# Patient Record
Sex: Male | Born: 1935
Health system: Southern US, Community
[De-identification: ages and names within clinical notes are randomized; demographics above are authoritative.]

## PROBLEM LIST (undated history)

## (undated) DIAGNOSIS — R011 Cardiac murmur, unspecified: Secondary | ICD-10-CM

## (undated) DIAGNOSIS — H269 Unspecified cataract: Secondary | ICD-10-CM

## (undated) DIAGNOSIS — C801 Malignant (primary) neoplasm, unspecified: Secondary | ICD-10-CM

## (undated) DIAGNOSIS — M1990 Unspecified osteoarthritis, unspecified site: Secondary | ICD-10-CM

## (undated) DIAGNOSIS — Z860101 Personal history of adenomatous and serrated colon polyps: Secondary | ICD-10-CM

## (undated) DIAGNOSIS — Z974 Presence of external hearing-aid: Secondary | ICD-10-CM

## (undated) DIAGNOSIS — I10 Essential (primary) hypertension: Secondary | ICD-10-CM

## (undated) DIAGNOSIS — Z86010 Personal history of colonic polyps: Secondary | ICD-10-CM

## (undated) DIAGNOSIS — E871 Hypo-osmolality and hyponatremia: Secondary | ICD-10-CM

## (undated) DIAGNOSIS — T7840XA Allergy, unspecified, initial encounter: Secondary | ICD-10-CM

## (undated) HISTORY — DX: Unspecified cataract: H26.9

## (undated) HISTORY — DX: Essential (primary) hypertension: I10

## (undated) HISTORY — PX: COLONOSCOPY: SHX174

## (undated) HISTORY — PX: EYE SURGERY: SHX253

## (undated) HISTORY — DX: Personal history of adenomatous and serrated colon polyps: Z86.0101

## (undated) HISTORY — DX: Personal history of colonic polyps: Z86.010

## (undated) HISTORY — DX: Unspecified osteoarthritis, unspecified site: M19.90

## (undated) HISTORY — DX: Hypo-osmolality and hyponatremia: E87.1

## (undated) HISTORY — DX: Allergy, unspecified, initial encounter: T78.40XA

---

## 2012-02-11 DIAGNOSIS — H2510 Age-related nuclear cataract, unspecified eye: Secondary | ICD-10-CM | POA: Diagnosis not present

## 2012-04-07 ENCOUNTER — Ambulatory Visit: Payer: Self-pay | Admitting: Gastroenterology

## 2012-04-07 DIAGNOSIS — Z79899 Other long term (current) drug therapy: Secondary | ICD-10-CM | POA: Diagnosis not present

## 2012-04-07 DIAGNOSIS — D126 Benign neoplasm of colon, unspecified: Secondary | ICD-10-CM | POA: Diagnosis not present

## 2012-04-07 DIAGNOSIS — I10 Essential (primary) hypertension: Secondary | ICD-10-CM | POA: Diagnosis not present

## 2012-04-07 DIAGNOSIS — Z1211 Encounter for screening for malignant neoplasm of colon: Secondary | ICD-10-CM | POA: Diagnosis not present

## 2012-04-07 DIAGNOSIS — K5730 Diverticulosis of large intestine without perforation or abscess without bleeding: Secondary | ICD-10-CM | POA: Diagnosis not present

## 2012-04-07 DIAGNOSIS — Z86010 Personal history of colonic polyps: Secondary | ICD-10-CM | POA: Insufficient documentation

## 2012-04-07 DIAGNOSIS — Z7982 Long term (current) use of aspirin: Secondary | ICD-10-CM | POA: Diagnosis not present

## 2012-04-07 LAB — HM COLONOSCOPY

## 2012-04-09 LAB — PATHOLOGY REPORT

## 2012-05-15 DIAGNOSIS — L821 Other seborrheic keratosis: Secondary | ICD-10-CM | POA: Diagnosis not present

## 2012-05-15 DIAGNOSIS — L570 Actinic keratosis: Secondary | ICD-10-CM | POA: Diagnosis not present

## 2012-10-01 DIAGNOSIS — Z23 Encounter for immunization: Secondary | ICD-10-CM | POA: Diagnosis not present

## 2012-10-31 DIAGNOSIS — N401 Enlarged prostate with lower urinary tract symptoms: Secondary | ICD-10-CM | POA: Diagnosis not present

## 2012-12-15 DIAGNOSIS — N400 Enlarged prostate without lower urinary tract symptoms: Secondary | ICD-10-CM | POA: Diagnosis not present

## 2012-12-15 DIAGNOSIS — Z0000 Encounter for general adult medical examination without abnormal findings: Secondary | ICD-10-CM | POA: Diagnosis not present

## 2012-12-15 DIAGNOSIS — I10 Essential (primary) hypertension: Secondary | ICD-10-CM | POA: Diagnosis not present

## 2013-02-24 DIAGNOSIS — H2510 Age-related nuclear cataract, unspecified eye: Secondary | ICD-10-CM | POA: Diagnosis not present

## 2013-03-19 DIAGNOSIS — I10 Essential (primary) hypertension: Secondary | ICD-10-CM | POA: Diagnosis not present

## 2013-03-19 DIAGNOSIS — L570 Actinic keratosis: Secondary | ICD-10-CM | POA: Diagnosis not present

## 2013-03-19 DIAGNOSIS — N400 Enlarged prostate without lower urinary tract symptoms: Secondary | ICD-10-CM | POA: Diagnosis not present

## 2013-03-19 DIAGNOSIS — Z0000 Encounter for general adult medical examination without abnormal findings: Secondary | ICD-10-CM | POA: Diagnosis not present

## 2013-06-03 DIAGNOSIS — H905 Unspecified sensorineural hearing loss: Secondary | ICD-10-CM | POA: Diagnosis not present

## 2013-09-14 DIAGNOSIS — Z23 Encounter for immunization: Secondary | ICD-10-CM | POA: Diagnosis not present

## 2013-11-02 DIAGNOSIS — N401 Enlarged prostate with lower urinary tract symptoms: Secondary | ICD-10-CM | POA: Diagnosis not present

## 2013-12-29 DIAGNOSIS — N400 Enlarged prostate without lower urinary tract symptoms: Secondary | ICD-10-CM | POA: Diagnosis not present

## 2013-12-29 DIAGNOSIS — I10 Essential (primary) hypertension: Secondary | ICD-10-CM | POA: Diagnosis not present

## 2013-12-29 DIAGNOSIS — Z0000 Encounter for general adult medical examination without abnormal findings: Secondary | ICD-10-CM | POA: Diagnosis not present

## 2013-12-30 DIAGNOSIS — I10 Essential (primary) hypertension: Secondary | ICD-10-CM | POA: Diagnosis not present

## 2014-03-18 DIAGNOSIS — L819 Disorder of pigmentation, unspecified: Secondary | ICD-10-CM | POA: Diagnosis not present

## 2014-03-18 DIAGNOSIS — L570 Actinic keratosis: Secondary | ICD-10-CM | POA: Diagnosis not present

## 2014-03-18 DIAGNOSIS — D1800 Hemangioma unspecified site: Secondary | ICD-10-CM | POA: Diagnosis not present

## 2014-03-24 DIAGNOSIS — Z0000 Encounter for general adult medical examination without abnormal findings: Secondary | ICD-10-CM | POA: Diagnosis not present

## 2014-03-24 DIAGNOSIS — N400 Enlarged prostate without lower urinary tract symptoms: Secondary | ICD-10-CM | POA: Diagnosis not present

## 2014-03-24 DIAGNOSIS — I10 Essential (primary) hypertension: Secondary | ICD-10-CM | POA: Diagnosis not present

## 2014-03-30 DIAGNOSIS — H40009 Preglaucoma, unspecified, unspecified eye: Secondary | ICD-10-CM | POA: Diagnosis not present

## 2014-07-02 DIAGNOSIS — H903 Sensorineural hearing loss, bilateral: Secondary | ICD-10-CM | POA: Diagnosis not present

## 2014-07-02 DIAGNOSIS — H6120 Impacted cerumen, unspecified ear: Secondary | ICD-10-CM | POA: Diagnosis not present

## 2014-09-24 DIAGNOSIS — Z23 Encounter for immunization: Secondary | ICD-10-CM | POA: Diagnosis not present

## 2015-01-21 DIAGNOSIS — Z1389 Encounter for screening for other disorder: Secondary | ICD-10-CM | POA: Diagnosis not present

## 2015-01-21 DIAGNOSIS — Z0000 Encounter for general adult medical examination without abnormal findings: Secondary | ICD-10-CM | POA: Diagnosis not present

## 2015-01-21 DIAGNOSIS — I10 Essential (primary) hypertension: Secondary | ICD-10-CM | POA: Diagnosis not present

## 2015-01-21 DIAGNOSIS — Z23 Encounter for immunization: Secondary | ICD-10-CM | POA: Diagnosis not present

## 2015-01-24 DIAGNOSIS — I10 Essential (primary) hypertension: Secondary | ICD-10-CM | POA: Diagnosis not present

## 2015-01-24 DIAGNOSIS — Z136 Encounter for screening for cardiovascular disorders: Secondary | ICD-10-CM | POA: Diagnosis not present

## 2015-01-24 LAB — BASIC METABOLIC PANEL
BUN: 26 mg/dL — AB (ref 4–21)
Creatinine: 0.9 mg/dL (ref 0.6–1.3)
GLUCOSE: 103 mg/dL
Potassium: 4.4 mmol/L (ref 3.4–5.3)
Sodium: 136 mmol/L — AB (ref 137–147)

## 2015-01-24 LAB — LIPID PANEL
Cholesterol: 136 mg/dL (ref 0–200)
HDL: 46 mg/dL (ref 35–70)
LDL Cholesterol: 78 mg/dL
Triglycerides: 59 mg/dL (ref 40–160)

## 2015-03-23 DIAGNOSIS — L853 Xerosis cutis: Secondary | ICD-10-CM | POA: Diagnosis not present

## 2015-03-23 DIAGNOSIS — D229 Melanocytic nevi, unspecified: Secondary | ICD-10-CM | POA: Diagnosis not present

## 2015-03-23 DIAGNOSIS — L578 Other skin changes due to chronic exposure to nonionizing radiation: Secondary | ICD-10-CM | POA: Diagnosis not present

## 2015-03-23 DIAGNOSIS — I8310 Varicose veins of unspecified lower extremity with inflammation: Secondary | ICD-10-CM | POA: Diagnosis not present

## 2015-03-23 DIAGNOSIS — D692 Other nonthrombocytopenic purpura: Secondary | ICD-10-CM | POA: Diagnosis not present

## 2015-03-23 DIAGNOSIS — L718 Other rosacea: Secondary | ICD-10-CM | POA: Diagnosis not present

## 2015-03-23 DIAGNOSIS — L720 Epidermal cyst: Secondary | ICD-10-CM | POA: Diagnosis not present

## 2015-03-23 DIAGNOSIS — T07 Unspecified multiple injuries: Secondary | ICD-10-CM | POA: Diagnosis not present

## 2015-03-23 DIAGNOSIS — L570 Actinic keratosis: Secondary | ICD-10-CM | POA: Diagnosis not present

## 2015-03-23 DIAGNOSIS — L821 Other seborrheic keratosis: Secondary | ICD-10-CM | POA: Diagnosis not present

## 2015-03-23 DIAGNOSIS — B359 Dermatophytosis, unspecified: Secondary | ICD-10-CM | POA: Diagnosis not present

## 2015-03-23 DIAGNOSIS — Z1283 Encounter for screening for malignant neoplasm of skin: Secondary | ICD-10-CM | POA: Diagnosis not present

## 2015-05-05 DIAGNOSIS — H40003 Preglaucoma, unspecified, bilateral: Secondary | ICD-10-CM | POA: Diagnosis not present

## 2015-06-15 ENCOUNTER — Telehealth: Payer: Self-pay | Admitting: Emergency Medicine

## 2015-06-15 MED ORDER — LISINOPRIL-HYDROCHLOROTHIAZIDE 20-12.5 MG PO TABS: 1.0000 | ORAL_TABLET | Freq: Every day | ORAL | Status: DC

## 2015-06-15 NOTE — Telephone Encounter (Signed)
Pt pharmacy sent over request for refill for pt medication.

## 2015-07-07 ENCOUNTER — Other Ambulatory Visit: Payer: Self-pay | Admitting: *Deleted

## 2015-07-07 ENCOUNTER — Other Ambulatory Visit: Payer: Self-pay | Admitting: Family Medicine

## 2015-07-07 MED ORDER — LISINOPRIL-HYDROCHLOROTHIAZIDE 20-12.5 MG PO TABS: 1.0000 | ORAL_TABLET | Freq: Every day | ORAL | Status: DC

## 2015-07-07 NOTE — Telephone Encounter (Signed)
Rx resent to pharmacy

## 2015-09-16 DIAGNOSIS — Z23 Encounter for immunization: Secondary | ICD-10-CM | POA: Diagnosis not present

## 2015-12-18 ENCOUNTER — Other Ambulatory Visit: Payer: Self-pay | Admitting: Family Medicine

## 2015-12-21 ENCOUNTER — Other Ambulatory Visit: Payer: Self-pay | Admitting: Family Medicine

## 2015-12-21 MED ORDER — VERAPAMIL HCL ER 120 MG PO CP24: 120.0000 mg | ORAL_CAPSULE | Freq: Every day | ORAL | Status: DC

## 2016-01-20 DIAGNOSIS — I10 Essential (primary) hypertension: Secondary | ICD-10-CM | POA: Insufficient documentation

## 2016-01-20 DIAGNOSIS — N400 Enlarged prostate without lower urinary tract symptoms: Secondary | ICD-10-CM | POA: Insufficient documentation

## 2016-01-23 ENCOUNTER — Encounter: Payer: Self-pay | Admitting: Family Medicine

## 2016-01-23 ENCOUNTER — Ambulatory Visit (INDEPENDENT_AMBULATORY_CARE_PROVIDER_SITE_OTHER): Payer: Medicare Other | Admitting: Family Medicine

## 2016-01-23 VITALS — BP 140/70 | HR 67 | Temp 97.5°F | Resp 16 | Ht 68.25 in | Wt 163.0 lb

## 2016-01-23 DIAGNOSIS — I10 Essential (primary) hypertension: Secondary | ICD-10-CM | POA: Diagnosis not present

## 2016-01-23 DIAGNOSIS — Z0000 Encounter for general adult medical examination without abnormal findings: Secondary | ICD-10-CM

## 2016-01-23 NOTE — Progress Notes (Signed)
Patient: Travis Austin, Male    DOB: June 21, 1931, 82 y.o.   MRN: [id number] Visit Date: 01/23/2016  Today's Provider: Lelon Huh, MD   Chief Complaint  Patient presents with  . Medicare Wellness  . Hypertension   Subjective:    Annual wellness visit Travis Austin is a 82 y.o. male. He feels well. He reports exercising weekly. He reports he is sleeping well.  -----------------------------------------------------------  Hypertension, follow-up:  BP Readings from Last 3 Encounters:  01/21/15 136/64    He was last seen for hypertension 1 years ago.  BP at that visit was 136/64. Management since that visit includes no changes. He reports good compliance with treatment. He is not having side effects.  He is exercising. He is adherent to low salt diet.   Outside blood pressures are [id number] systolic over 0000000 diastolic. He is experiencing none.  Patient denies chest pain, chest pressure/discomfort, claudication, dyspnea, exertional chest pressure/discomfort, fatigue, irregular heart beat, lower extremity edema, near-syncope, orthopnea, palpitations, paroxysmal nocturnal dyspnea, syncope and tachypnea.   Cardiovascular risk factors include advanced age (older than 65 for men, 36 for women) and hypertension.  Use of agents associated with hypertension: NSAIDS.     Weight trend: stable Wt Readings from Last 3 Encounters:  01/21/15 162 lb (73.483 kg)    Current diet: in general, a "healthy" diet    ------------------------------------------------------------------------    Review of Systems  Constitutional: Negative for fever, chills, appetite change and fatigue.  HENT: Positive for hearing loss. Negative for congestion, ear pain, nosebleeds and trouble swallowing.   Eyes: Negative for pain and visual disturbance.  Respiratory: Negative for cough, chest tightness and shortness of breath.   Cardiovascular: Negative for chest pain, palpitations and leg swelling.    Gastrointestinal: Negative for nausea, vomiting, abdominal pain, diarrhea, constipation and blood in stool.  Endocrine: Negative for polydipsia, polyphagia and polyuria.  Genitourinary: Negative for dysuria and flank pain.  Musculoskeletal: Negative for myalgias, back pain, joint swelling, arthralgias and neck stiffness.  Skin: Negative for color change, rash and wound.  Allergic/Immunologic: Positive for food allergies.  Neurological: Negative for dizziness, tremors, seizures, speech difficulty, weakness, light-headedness and headaches.  Psychiatric/Behavioral: Negative for behavioral problems, confusion, sleep disturbance, dysphoric mood and decreased concentration. The patient is not nervous/anxious.   All other systems reviewed and are negative.   Social History   Social History  . Marital Status: Widowed    Spouse Name: N/A  . Number of Children: 1  . Years of Education: N/A   Occupational History  . Retired    Social History Main Topics  . Smoking status: Never Smoker   . Smokeless tobacco: Not on file  . Alcohol Use: 0.0 oz/week    0 Standard drinks or equivalent per week     Comment: 1-2 glasses of wine daily  . Drug Use: No  . Sexual Activity: Not on file   Other Topics Concern  . Not on file   Social History Narrative    Past Medical History  Diagnosis Date  . History of adenomatous polyp of colon      Patient Active Problem List   Diagnosis Date Noted  . BPH (benign prostatic hypertrophy) 01/20/2016  . Hypertension 01/20/2016  . History of adenomatous polyp of colon 04/07/2012  . Headache 08/24/2011    No past surgical history on file.  His family history includes Arthritis in his mother; Heart disease in his father; Hypertension in his father; Parkinson's disease  in his mother.    Previous Medications   ASPIRIN 81 MG TABLET    Take 1 tablet by mouth daily.   LISINOPRIL-HYDROCHLOROTHIAZIDE (ZESTORETIC) 20-12.5 MG PER TABLET    Take 1 tablet by  mouth daily.   MULTIPLE VITAMINS-MINERALS (MULTIVITAMIN ADULT PO)    Take 1 tablet by mouth daily.   VERAPAMIL (VERELAN PM) 120 MG 24 HR CAPSULE    Take 1 capsule (120 mg total) by mouth daily.    Patient Care Team: Birdie Sons, MD as PCP - General (Family Medicine) Abbie Sons, MD (Urology) Provider Not In System     Objective:   Vitals: BP 140/70 mmHg  Pulse 67  Temp(Src) 97.5 F (36.4 C) (Oral)  Resp 16  Ht 5' 8.25" (1.734 m)  Wt 163 lb (73.936 kg)  BMI 24.59 kg/m2  SpO2 97%  Physical Exam   General Appearance:    Alert, cooperative, no distress, appears stated age  Head:    Normocephalic, without obvious abnormality, atraumatic  Eyes:    PERRL, conjunctiva/corneas clear, EOM's intact, fundi    benign, both eyes       Ears:    Normal TM's and external ear canals, both ears  Nose:   Nares normal, septum midline, mucosa normal, no drainage   or sinus tenderness  Throat:   Lips, mucosa, and tongue normal; teeth and gums normal  Neck:   Supple, symmetrical, trachea midline, no adenopathy;       thyroid:  No enlargement/tenderness/nodules; no carotid   bruit or JVD  Back:     Symmetric, no curvature, ROM normal, no CVA tenderness  Lungs:     Clear to auscultation bilaterally, respirations unlabored  Chest wall:    No tenderness or deformity  Heart:    Regular rate and rhythm, S1 and S2 normal, no murmur, rub   or gallop  Abdomen:     Soft, non-tender, bowel sounds active all four quadrants,    no masses, no organomegaly  Genitalia:    deferred  Rectal:    deferred  Extremities:   Extremities normal, atraumatic, no cyanosis or edema  Pulses:   2+ and symmetric all extremities  Skin:   Skin color, texture, turgor normal, no rashes or lesions  Lymph nodes:   Cervical, supraclavicular, and axillary nodes normal  Neurologic:   CNII-XII intact. Normal strength, sensation and reflexes      throughout    Activities of Daily Living In your present state of health,  do you have any difficulty performing the following activities: 01/23/2016  Hearing? Y  Vision? Y  Difficulty concentrating or making decisions? N  Walking or climbing stairs? N  Dressing or bathing? N  Doing errands, shopping? N    Fall Risk Assessment Fall Risk  01/23/2016  Falls in the past year? Yes  Number falls in past yr: 1  Injury with Fall? No  Follow up Falls evaluation completed     Depression Screen PHQ 2/9 Scores 01/23/2016  PHQ - 2 Score 0    Cognitive Testing - 6-CIT  Correct? Score   What year is it? yes 0 0 or 4  What month is it? yes 0 0 or 3  Memorize:    Pia Mau,  44,  [place],      What time is it? (within 1 hour) yes 0 0 or 3  Count backwards from 20 yes 0 0, 2, or 4  Name the months of the  year yes 0 0, 2, or 4  Repeat name & address above no 3 0, 2, 4, 6, 8, or 10       TOTAL SCORE  3/28   Interpretation:  Normal  Normal (0-7) Abnormal (8-28)   Audit-C Alcohol Use Screening  Question Answer Points  How often do you have alcoholic drink? 4 times weekly 4  On days you do drink alcohol, how many drinks do you typically consume? 1 or 2 0  How oftey will you drink 6 or more in a total? never 0  Total Score:  4   A score of 3 or more in women, and 4 or more in men indicates increased risk for alcohol abuse, EXCEPT if all of the points are from question 1.     Assessment & Plan:     Annual Wellness Visit  Reviewed patient's Family Medical History Reviewed and updated list of patient's medical providers Assessment of cognitive impairment was done Assessed patient's functional ability Established a written schedule for health screening [place] Completed and Reviewed  Exercise Activities and Dietary recommendations Goals    None      Immunization History  Administered Date(s) Administered  . Pneumococcal Conjugate-13 01/21/2015  . Pneumococcal Polysaccharide-23 07/02/2001  . Td 05/29/2004  . Tdap  08/24/2011  . Zoster 11/24/2008    Health Maintenance  Topic Date Due  . INFLUENZA VACCINE  07/17/2016 (Originally 07/18/2015)  . TETANUS/TDAP  08/23/2021  . ZOSTAVAX  Completed  . PNA vac Low Risk Adult  Completed      Discussed health benefits of physical activity, and encouraged him to engage in regular exercise appropriate for his age and condition.    ------------------------------------------------------------------------------------------------------------ 1. Medicare annual wellness visit, subsequent   2. Essential hypertension Well controlled.  Continue current medications.   - Renal function panel - EKG 12-Lead

## 2016-01-24 LAB — RENAL FUNCTION PANEL
Albumin: 4.2 g/dL (ref 3.5–4.7)
BUN/Creatinine Ratio: 26 — ABNORMAL HIGH (ref 10–22)
BUN: 24 mg/dL (ref 8–27)
CHLORIDE: 90 mmol/L — AB (ref 96–106)
CO2: 24 mmol/L (ref 18–29)
Calcium: 9.2 mg/dL (ref 8.6–10.2)
Creatinine, Ser: 0.94 mg/dL (ref 0.76–1.27)
GFR, EST AFRICAN AMERICAN: 87 mL/min/{1.73_m2} (ref >59)
GFR, EST NON AFRICAN AMERICAN: 75 mL/min/{1.73_m2} (ref >59)
GLUCOSE: 108 mg/dL — AB (ref 65–99)
Phosphorus: 3.9 mg/dL (ref 2.5–4.5)
Potassium: 4.3 mmol/L (ref 3.5–5.2)
Sodium: 132 mmol/L — ABNORMAL LOW (ref 134–144)

## 2016-03-05 ENCOUNTER — Other Ambulatory Visit: Payer: Self-pay | Admitting: Family Medicine

## 2016-03-22 DIAGNOSIS — D229 Melanocytic nevi, unspecified: Secondary | ICD-10-CM | POA: Diagnosis not present

## 2016-03-22 DIAGNOSIS — D485 Neoplasm of uncertain behavior of skin: Secondary | ICD-10-CM | POA: Diagnosis not present

## 2016-03-22 DIAGNOSIS — D692 Other nonthrombocytopenic purpura: Secondary | ICD-10-CM | POA: Diagnosis not present

## 2016-03-22 DIAGNOSIS — B351 Tinea unguium: Secondary | ICD-10-CM | POA: Diagnosis not present

## 2016-03-22 DIAGNOSIS — D1800 Hemangioma unspecified site: Secondary | ICD-10-CM | POA: Diagnosis not present

## 2016-03-22 DIAGNOSIS — L820 Inflamed seborrheic keratosis: Secondary | ICD-10-CM | POA: Diagnosis not present

## 2016-03-22 DIAGNOSIS — L821 Other seborrheic keratosis: Secondary | ICD-10-CM | POA: Diagnosis not present

## 2016-03-22 DIAGNOSIS — L578 Other skin changes due to chronic exposure to nonionizing radiation: Secondary | ICD-10-CM | POA: Diagnosis not present

## 2016-03-22 DIAGNOSIS — Z1283 Encounter for screening for malignant neoplasm of skin: Secondary | ICD-10-CM | POA: Diagnosis not present

## 2016-03-22 DIAGNOSIS — C44712 Basal cell carcinoma of skin of right lower limb, including hip: Secondary | ICD-10-CM | POA: Diagnosis not present

## 2016-03-22 DIAGNOSIS — L812 Freckles: Secondary | ICD-10-CM | POA: Diagnosis not present

## 2016-04-10 DIAGNOSIS — C44712 Basal cell carcinoma of skin of right lower limb, including hip: Secondary | ICD-10-CM | POA: Diagnosis not present

## 2016-05-07 DIAGNOSIS — H2513 Age-related nuclear cataract, bilateral: Secondary | ICD-10-CM | POA: Diagnosis not present

## 2016-06-04 DIAGNOSIS — H903 Sensorineural hearing loss, bilateral: Secondary | ICD-10-CM | POA: Diagnosis not present

## 2016-08-09 ENCOUNTER — Other Ambulatory Visit: Payer: Self-pay | Admitting: Family Medicine

## 2016-08-10 NOTE — Telephone Encounter (Signed)
Had wellness on 01/23/2016. Renaldo Fiddler, CMA

## 2016-10-01 DIAGNOSIS — Z23 Encounter for immunization: Secondary | ICD-10-CM | POA: Diagnosis not present

## 2016-12-28 ENCOUNTER — Encounter: Payer: Self-pay | Admitting: Family Medicine

## 2016-12-28 ENCOUNTER — Ambulatory Visit (INDEPENDENT_AMBULATORY_CARE_PROVIDER_SITE_OTHER): Payer: Medicare Other | Admitting: Family Medicine

## 2016-12-28 VITALS — BP 130/80 | HR 91 | Temp 101.6°F | Resp 16 | Wt 169.0 lb

## 2016-12-28 DIAGNOSIS — R6889 Other general symptoms and signs: Secondary | ICD-10-CM

## 2016-12-28 MED ORDER — OSELTAMIVIR PHOSPHATE 75 MG PO CAPS: 75.0000 mg | ORAL_CAPSULE | Freq: Two times a day (BID) | ORAL | 0 refills | Status: AC

## 2016-12-28 NOTE — Progress Notes (Signed)
Patient: Travis Austin Male    DOB: July 08, 1931   83 y.o.   MRN: [id number] Visit Date: 12/28/2016  Today's Provider: Lelon Huh, MD   Chief Complaint  Patient presents with  . Fever  . Cough   Subjective:    Cough started 2 days ago. Patient states cough is non-productive. Patient feels feverish and fatigue. Also has muscle aches and  Headache. Patient has not taken any medications for his symptoms.    Cough  This is a new problem. The current episode started in the past 7 days (2 days ago). The problem has been gradually worsening. The problem occurs every few minutes. The cough is non-productive. Associated symptoms include chills, a fever, headaches, myalgias and a sore throat. Pertinent negatives include no chest pain, ear congestion, ear pain, heartburn, hemoptysis, nasal congestion, postnasal drip, rash, rhinorrhea, shortness of breath, sweats, weight loss or wheezing. Nothing aggravates the symptoms. He has tried nothing for the symptoms.  Cough has improved today. Throat is still sore. Very fatigued and achy.      No Active Allergies   Current Outpatient Prescriptions:  .  aspirin 81 MG tablet, Take 1 tablet by mouth daily., Disp: , Rfl:  .  lisinopril-hydrochlorothiazide (PRINZIDE,ZESTORETIC) 20-12.5 MG tablet, TAKE 1 TABLET DAILY, Disp: 90 tablet, Rfl: 3 .  Multiple Vitamins-Minerals (MULTIVITAMIN ADULT PO), Take 1 tablet by mouth daily., Disp: , Rfl:  .  verapamil (VERELAN PM) 120 MG 24 hr capsule, TAKE 1 CAPSULE DAILY, Disp: 90 capsule, Rfl: 4  Review of Systems  Constitutional: Positive for chills, fatigue and fever. Negative for weight loss.  HENT: Positive for sore throat. Negative for congestion, ear pain, postnasal drip, rhinorrhea, sinus pain, sinus pressure and sneezing.   Respiratory: Positive for cough. Negative for hemoptysis, shortness of breath and wheezing.   Cardiovascular: Negative for chest pain.  Gastrointestinal: Negative for heartburn.    Musculoskeletal: Positive for myalgias.  Skin: Negative for rash.  Neurological: Positive for weakness and headaches.    Social History  Substance Use Topics  . Smoking status: Never Smoker  . Smokeless tobacco: Not on file  . Alcohol use 0.0 oz/week     Comment: 1-2 glasses of wine daily   Objective:   BP 130/80 (BP Location: Right Arm, Patient Position: Sitting, Cuff Size: Normal)   Pulse 91   Temp (!) 101.6 F (38.7 C) (Oral)   Resp 16   Wt 169 lb (76.7 kg)   SpO2 96%   BMI 25.51 kg/m   Physical Exam  General Appearance:    Alert, cooperative, no distress  HENT:   ENT exam normal, no neck nodes or sinus tenderness, slight erythema pharynx.   Eyes:    PERRL, conjunctiva/corneas clear, EOM's intact       Lungs:     Clear to auscultation bilaterally, respirations unlabored  Heart:    Regular rate and rhythm  Neurologic:   Awake, alert, oriented x 3. No apparent focal neurological           defect.           Assessment & Plan:     1. Flu-like symptoms Likely influenza. No sign of complications or bacterial infection.  - oseltamivir (TAMIFLU) 75 MG capsule; Take 1 capsule (75 mg total) by mouth 2 (two) times daily.  Dispense: 10 capsule; Refill: 0  Call if symptoms change or if not rapidly improving.          Lelon Huh,  MD  [facility]

## 2016-12-28 NOTE — Patient Instructions (Signed)

## 2017-01-23 ENCOUNTER — Ambulatory Visit (INDEPENDENT_AMBULATORY_CARE_PROVIDER_SITE_OTHER): Payer: Medicare Other

## 2017-01-23 ENCOUNTER — Ambulatory Visit (INDEPENDENT_AMBULATORY_CARE_PROVIDER_SITE_OTHER): Payer: Medicare Other | Admitting: Family Medicine

## 2017-01-23 VITALS — BP 138/70

## 2017-01-23 VITALS — BP 141/70 | HR 60 | Temp 97.9°F | Ht 68.0 in | Wt 161.8 lb

## 2017-01-23 DIAGNOSIS — I10 Essential (primary) hypertension: Secondary | ICD-10-CM

## 2017-01-23 DIAGNOSIS — Z0000 Encounter for general adult medical examination without abnormal findings: Secondary | ICD-10-CM | POA: Diagnosis not present

## 2017-01-23 NOTE — Patient Instructions (Addendum)

## 2017-01-23 NOTE — Progress Notes (Signed)
Subjective:   Travis Austin is a 83 y.o. male who presents for Medicare Annual/Subsequent preventive examination.  Review of Systems:  N/A  Cardiac Risk Factors include: advanced age (>25men, >88 women);hypertension;male gender     Objective:    Vitals: BP (!) 141/70 (BP Location: Right Arm)   Pulse 60   Temp 97.9 F (36.6 C) (Oral)   Ht 5\' 8"  (1.727 m)   Wt 161 lb 12.8 oz (73.4 kg)   BMI 24.60 kg/m   Body mass index is 24.6 kg/m.  Tobacco History  Smoking Status  . Never Smoker  Smokeless Tobacco  . Never Used     Counseling given: Not Answered   Past Medical History:  Diagnosis Date  . History of adenomatous polyp of colon    History reviewed. No pertinent surgical history. Family History  Problem Relation Age of Onset  . Arthritis Mother   . Parkinson's disease Mother   . Heart disease Father   . Hypertension Father    History  Sexual Activity  . Sexual activity: Not on file    Outpatient Encounter Prescriptions as of 01/23/2017  Medication Sig  . aspirin 81 MG tablet Take 1 tablet by mouth daily.  Marland Kitchen lisinopril-hydrochlorothiazide (PRINZIDE,ZESTORETIC) 20-12.5 MG tablet TAKE 1 TABLET DAILY  . Multiple Vitamins-Minerals (MULTIVITAMIN ADULT PO) Take 1 tablet by mouth daily.  . verapamil (VERELAN PM) 120 MG 24 hr capsule TAKE 1 CAPSULE DAILY   No facility-administered encounter medications on file as of 01/23/2017.     Activities of Daily Living In your present state of health, do you have any difficulty performing the following activities: 01/23/2017  Hearing? Y  Vision? Y  Difficulty concentrating or making decisions? N  Walking or climbing stairs? N  Dressing or bathing? N  Doing errands, shopping? N  Preparing Food and eating ? N  Using the Toilet? N  In the past six months, have you accidently leaked urine? N  Do you have problems with loss of bowel control? N  Managing your Medications? N  Managing your Finances? N  Housekeeping or managing  your Housekeeping? N  Some recent data might be hidden    Patient Care Team: Birdie Sons, MD as PCP - General (Family Medicine) Clyde Canterbury, MD as Referring Physician (Otolaryngology) Delora Fuel, MD as Referring Physician (Audiology) Eulogio Bear, MD as Consulting Physician (Ophthalmology) Ralene Bathe, MD as Consulting Physician (Dermatology)   Assessment:     Exercise Activities and Dietary recommendations Current Exercise Habits: Home exercise routine, Type of exercise: Other - see comments;walking (stationary bicycle ), Time (Minutes): > 60, Frequency (Times/Week): 4 (-5), Weekly Exercise (Minutes/Week): 0, Intensity: Mild, Exercise limited by: None identified  Goals    . Increase water intake          Starting 01/23/17, I will drink 3-4 glasses of water a day.      Fall Risk Fall Risk  01/23/2017 01/23/2016  Falls in the past year? Yes Yes  Number falls in past yr: 2 or more 1  Injury with Fall? No No  Follow up Falls prevention discussed Falls evaluation completed   Depression Screen PHQ 2/9 Scores 01/23/2017 01/23/2016  PHQ - 2 Score 0 0    Cognitive Function  Pt declined testing. However pt did remember address used from last year "[address] [place]"      Immunization History  Administered Date(s) Administered  . Influenza-Unspecified 09/17/2015  . Pneumococcal Conjugate-13 01/21/2015  .  Pneumococcal Polysaccharide-23 07/02/2001  . Td 05/29/2004  . Tdap 08/24/2011  . Zoster 11/24/2008   Screening Tests Health Maintenance  Topic Date Due  . TETANUS/TDAP  08/23/2021  . INFLUENZA VACCINE  Completed  . ZOSTAVAX  Completed  . PNA vac Low Risk Adult  Completed      Plan:  I have personally reviewed and addressed the Medicare Annual Wellness questionnaire and have noted the following in the patient's chart:  A. Medical and social history B. Use of alcohol, tobacco or illicit drugs  C. Current medications and  supplements D. Functional ability and status E.  Nutritional status F.  Physical activity G. Advance directives H. List of other physicians I.  Hospitalizations, surgeries, and ER visits in previous 12 months J.  [place] such as hearing and vision if needed, cognitive and depression L. Referrals and appointments - none  In addition, I have reviewed and discussed with patient certain preventive protocols, quality metrics, and best practice recommendations. A written personalized care plan for preventive services as well as general preventive health recommendations were provided to patient.  See attached scanned questionnaire for additional information.   Signed,  Fabio Neighbors, LPN Nurse Health Advisor   MD Recommendations: None.  I have reviewed the health advisor's note, was available for consultation, and agree with documentation and plan  Lelon Huh, MD

## 2017-01-23 NOTE — Progress Notes (Signed)
Patient: Travis Austin Male    DOB: October 13, 1931   83 y.o.   MRN: [id number] Visit Date: 01/23/2017  Today's Provider: Lelon Huh, MD   Chief Complaint  Patient presents with  . Hypertension   Subjective:    HPI  Follow up for hypertension after AWV with [facility].     Hypertension, follow-up:  BP Readings from Last 3 Encounters:  01/23/17 (!) 141/70  12/28/16 130/80  01/23/16 140/70    He was last seen for hypertension 1 years ago.  BP at that visit was 140/80. Management since that visit includes; checked labs, sodium level a bit low. Patient advised to get a little more salt in his diet to help replenish sodium.He reports good compliance with treatment. He is not having side effects. none He is exercising. He is adherent to low salt diet.   Outside blood pressures are 130/80. He is experiencing none.  Patient denies none.   Cardiovascular risk factors include none.  Use of agents associated with hypertension: none.   ----------------------------------------------------------------    No Active Allergies   Current Outpatient Prescriptions:  .  aspirin 81 MG tablet, Take 1 tablet by mouth daily., Disp: , Rfl:  .  lisinopril-hydrochlorothiazide (PRINZIDE,ZESTORETIC) 20-12.5 MG tablet, TAKE 1 TABLET DAILY, Disp: 90 tablet, Rfl: 3 .  Multiple Vitamins-Minerals (MULTIVITAMIN ADULT PO), Take 1 tablet by mouth daily., Disp: , Rfl:  .  verapamil (VERELAN PM) 120 MG 24 hr capsule, TAKE 1 CAPSULE DAILY, Disp: 90 capsule, Rfl: 4  Review of Systems  Constitutional: Negative for appetite change, chills and fever.  Respiratory: Negative for chest tightness, shortness of breath and wheezing.   Cardiovascular: Negative for chest pain and palpitations.  Gastrointestinal: Negative for abdominal pain, nausea and vomiting.    Social History  Substance Use Topics  . Smoking status: Never Smoker  . Smokeless tobacco: Never Used  . Alcohol use 4.2 oz/week    7 Glasses of  wine per week   Objective:    Vital Signs - Last Recorded  Most recent update: 01/23/2017 2:13 PM by Fabio Neighbors, LPN  BP    [id number] (BP Location: Right Arm)     Pulse  60     Temp  97.9 F (36.6 C) (Oral)     Ht  5\' 8"  (1.727 m)     Wt  161 lb 12.8 oz (73.4 kg)      BMI  24.60 kg/m       Physical Exam   General Appearance:    Alert, cooperative, no distress  Eyes:    PERRL, conjunctiva/corneas clear, EOM's intact       Lungs:     Clear to auscultation bilaterally, respirations unlabored  Heart:    Regular rate and rhythm  Neurologic:   Awake, alert, oriented x 3. No apparent focal neurological           defect.           Assessment & Plan:     1. Essential hypertension Borderline elevation BP today. Continue current medications for now.  - Renal function panel - EKG 12-Lead  The entirety of the information documented in the History of Present Illness, Review of Systems and Physical Exam were personally obtained by me. Portions of this information were initially documented by Theressa Millard, CMA and reviewed by me for thoroughness and accuracy.        Lelon Huh, MD  [facility]

## 2017-01-24 ENCOUNTER — Telehealth: Payer: Self-pay

## 2017-01-24 LAB — RENAL FUNCTION PANEL
Albumin: 4.3 g/dL (ref 3.5–4.7)
BUN / CREAT RATIO: 19 (ref 10–24)
BUN: 16 mg/dL (ref 8–27)
CALCIUM: 9.1 mg/dL (ref 8.6–10.2)
CHLORIDE: 88 mmol/L — AB (ref 96–106)
CO2: 25 mmol/L (ref 18–29)
CREATININE: 0.83 mg/dL (ref 0.76–1.27)
GFR calc Af Amer: 94 mL/min/{1.73_m2} (ref >59)
GFR calc non Af Amer: 81 mL/min/{1.73_m2} (ref >59)
Glucose: 99 mg/dL (ref 65–99)
PHOSPHORUS: 3.3 mg/dL (ref 2.5–4.5)
Potassium: 4.1 mmol/L (ref 3.5–5.2)
SODIUM: 130 mmol/L — AB (ref 134–144)

## 2017-01-24 NOTE — Telephone Encounter (Signed)
Advised pt of lab results. Pt verbally acknowledges understanding. Aldine Chakraborty Drozdowski, CMA   

## 2017-01-24 NOTE — Telephone Encounter (Signed)
-----   Message from Birdie Sons, MD sent at 01/24/2017  7:52 AM EST ----- Sodium Is slightly low. He should continue current blood pressure medications. Schedule follow up for BP check 5-6 months.

## 2017-03-03 ENCOUNTER — Other Ambulatory Visit: Payer: Self-pay | Admitting: Family Medicine

## 2017-04-01 DIAGNOSIS — Z85828 Personal history of other malignant neoplasm of skin: Secondary | ICD-10-CM | POA: Diagnosis not present

## 2017-04-01 DIAGNOSIS — D229 Melanocytic nevi, unspecified: Secondary | ICD-10-CM | POA: Diagnosis not present

## 2017-04-01 DIAGNOSIS — Z1283 Encounter for screening for malignant neoplasm of skin: Secondary | ICD-10-CM | POA: Diagnosis not present

## 2017-04-01 DIAGNOSIS — L821 Other seborrheic keratosis: Secondary | ICD-10-CM | POA: Diagnosis not present

## 2017-04-01 DIAGNOSIS — L812 Freckles: Secondary | ICD-10-CM | POA: Diagnosis not present

## 2017-04-01 DIAGNOSIS — Z808 Family history of malignant neoplasm of other organs or systems: Secondary | ICD-10-CM | POA: Diagnosis not present

## 2017-04-01 DIAGNOSIS — L719 Rosacea, unspecified: Secondary | ICD-10-CM | POA: Diagnosis not present

## 2017-04-01 DIAGNOSIS — L570 Actinic keratosis: Secondary | ICD-10-CM | POA: Diagnosis not present

## 2017-04-01 DIAGNOSIS — L578 Other skin changes due to chronic exposure to nonionizing radiation: Secondary | ICD-10-CM | POA: Diagnosis not present

## 2017-04-01 DIAGNOSIS — L309 Dermatitis, unspecified: Secondary | ICD-10-CM | POA: Diagnosis not present

## 2017-05-06 DIAGNOSIS — B351 Tinea unguium: Secondary | ICD-10-CM | POA: Diagnosis not present

## 2017-05-06 DIAGNOSIS — L853 Xerosis cutis: Secondary | ICD-10-CM | POA: Diagnosis not present

## 2017-05-06 DIAGNOSIS — L400 Psoriasis vulgaris: Secondary | ICD-10-CM | POA: Diagnosis not present

## 2017-05-06 DIAGNOSIS — L309 Dermatitis, unspecified: Secondary | ICD-10-CM | POA: Diagnosis not present

## 2017-05-10 DIAGNOSIS — H2513 Age-related nuclear cataract, bilateral: Secondary | ICD-10-CM | POA: Diagnosis not present

## 2017-06-24 ENCOUNTER — Ambulatory Visit (INDEPENDENT_AMBULATORY_CARE_PROVIDER_SITE_OTHER): Payer: Medicare Other | Admitting: Family Medicine

## 2017-06-24 ENCOUNTER — Encounter: Payer: Self-pay | Admitting: Family Medicine

## 2017-06-24 VITALS — BP 178/80 | HR 68 | Temp 97.8°F | Resp 18 | Wt 163.0 lb

## 2017-06-24 DIAGNOSIS — I10 Essential (primary) hypertension: Secondary | ICD-10-CM | POA: Diagnosis not present

## 2017-06-24 MED ORDER — SPIRONOLACTONE 25 MG PO TABS: 25.0000 mg | ORAL_TABLET | Freq: Every day | ORAL | 1 refills | Status: DC

## 2017-06-24 NOTE — Patient Instructions (Addendum)
   You will need to have some blood work done the first week of August. Expect a call to let you know when the lab order is ready. [email or web address]

## 2017-06-24 NOTE — Progress Notes (Signed)
Patient: Travis Austin Male    DOB: 02/06/1931   83 y.o.   MRN: [id number] Visit Date: 06/24/2017  Today's Provider: Lelon Huh, MD   Chief Complaint  Patient presents with  . Hypertension    follow up   Subjective:    HPI  Hypertension, follow-up:  BP Readings from Last 3 Encounters:  01/23/17 138/70  01/23/17 (!) 141/70  12/28/16 130/80    He was last seen for hypertension 5 months ago.  BP at that visit was 141/70. Management since that visit includes no changes. He reports good compliance with treatment. He is not having side effects.  He is exercising. He is adherent to low salt diet.   Outside blood pressures are averaging 130-135/70's, sometimes as low as 90/60.  Has started taking both his BP medications at night for the last couple of weeks.  He is experiencing none.  Patient denies chest pain, chest pressure/discomfort, claudication, dyspnea, exertional chest pressure/discomfort, fatigue, irregular heart beat, lower extremity edema, near-syncope, orthopnea, palpitations, paroxysmal nocturnal dyspnea, syncope and tachypnea.   Cardiovascular risk factors include advanced age (older than 35 for men, 26 for women), hypertension and male gender.  Use of agents associated with hypertension: NSAIDS.     Weight trend: stable Wt Readings from Last 3 Encounters:  01/23/17 161 lb 12.8 oz (73.4 kg)  12/28/16 169 lb (76.7 kg)  01/23/16 163 lb (73.9 kg)    Current diet: well balanced  ------------------------------------------------------------------------     No Active Allergies   Current Outpatient Prescriptions:  .  aspirin 81 MG tablet, Take 1 tablet by mouth daily., Disp: , Rfl:  .  lisinopril-hydrochlorothiazide (PRINZIDE,ZESTORETIC) 20-12.5 MG tablet, TAKE 1 TABLET DAILY, Disp: 90 tablet, Rfl: 3 .  Multiple Vitamins-Minerals (MULTIVITAMIN ADULT PO), Take 1 tablet by mouth daily., Disp: , Rfl:  .  verapamil (VERELAN PM) 120 MG 24 hr capsule, TAKE  1 CAPSULE DAILY, Disp: 90 capsule, Rfl: 4  Review of Systems  Constitutional: Negative for appetite change, chills and fever.  Respiratory: Negative for chest tightness, shortness of breath and wheezing.   Cardiovascular: Negative for chest pain and palpitations.  Gastrointestinal: Negative for abdominal pain, nausea and vomiting.    Social History  Substance Use Topics  . Smoking status: Never Smoker  . Smokeless tobacco: Never Used  . Alcohol use 4.2 oz/week    7 Glasses of wine per week   Objective:   BP (!) 178/80 (BP Location: Right Arm, Cuff Size: Normal)   Pulse 68 Comment: room air  Temp 97.8 F (36.6 C) (Oral)   Resp 18   Wt 163 lb (73.9 kg)   SpO2 98%   BMI 24.78 kg/m  Vitals:   06/24/17 1442 06/24/17 1445  BP: (!) 190/88 (!) 178/80  Pulse: 68   Resp: 18   Temp: 97.8 F (36.6 C)   TempSrc: Oral   SpO2: 98%   Weight: 163 lb (73.9 kg)      Physical Exam  General appearance: alert, well developed, well nourished, cooperative and in no distress Head: Normocephalic, without obvious abnormality, atraumatic Respiratory: Respirations even and unlabored, normal respiratory rate Extremities: No gross deformities Skin: Skin color, texture, turgor normal. No rashes seen  Psych: Appropriate mood and affect. Neurologic: Mental status: Alert, oriented to person, place, and time, thought content appropriate.     Assessment & Plan:      1. Essential hypertension Uncontrolled, although home blood pressure readings are better. Considering he has  been mildly hyponatremic on lisinopril-hctz will change to spironolactone (ALDACTONE) 25 MG tablet; Take 1 tablet (25 mg total) by mouth daily.  Dispense: 30 tablet; Refill: 1  Check renal panel in 3-4 weeks. Call if any home blood pressures over 150/90        Lelon Huh, MD  [facility]

## 2017-07-22 ENCOUNTER — Telehealth: Payer: Self-pay | Admitting: Family Medicine

## 2017-07-22 DIAGNOSIS — I10 Essential (primary) hypertension: Secondary | ICD-10-CM

## 2017-07-22 NOTE — Telephone Encounter (Signed)
Pt need a lab sheet for routine labs.  He said he was told to come pick it up Wednesday here before he goes to the lab  Pt's call call is [phone]  Thanks teri

## 2017-07-22 NOTE — Telephone Encounter (Signed)
error 

## 2017-07-22 NOTE — Telephone Encounter (Signed)
L/m stating below. Lab order printed.

## 2017-07-22 NOTE — Telephone Encounter (Signed)
Please advise patient it is time to check renal panel since we changed his bp meds last month. Please leave order for him to pick up at front desk.  Also he should have his BP checked he comes in to pick up his lab slip.

## 2017-07-24 ENCOUNTER — Encounter: Payer: Self-pay | Admitting: Family Medicine

## 2017-07-24 DIAGNOSIS — I10 Essential (primary) hypertension: Secondary | ICD-10-CM | POA: Diagnosis not present

## 2017-07-24 NOTE — Progress Notes (Signed)
Patient walked into office today for nurse blood pressure check, patient states that he was feeling well today and his only question was sholuld he continue Spironlactone? Patients blood pressure in house was 150/96, he states that he has had good compliance and tolerance on new medication. After reviewing patients chart Dr. Caryn Section advised that patient continue on medication pending his lab results and we will contact him with further instructions once labs are drawn. Patient understood and states that he will be getting labs drawn today.

## 2017-07-24 NOTE — Patient Instructions (Signed)
Continue medication and prescribed and await pending lab results.

## 2017-07-25 ENCOUNTER — Telehealth: Payer: Self-pay

## 2017-07-25 DIAGNOSIS — I10 Essential (primary) hypertension: Secondary | ICD-10-CM

## 2017-07-25 LAB — RENAL FUNCTION PANEL
Albumin: 4.3 g/dL (ref 3.5–4.7)
BUN / CREAT RATIO: 25 — AB (ref 10–24)
BUN: 23 mg/dL (ref 8–27)
CO2: 22 mmol/L (ref 20–29)
CREATININE: 0.93 mg/dL (ref 0.76–1.27)
Calcium: 9.1 mg/dL (ref 8.6–10.2)
Chloride: 95 mmol/L — ABNORMAL LOW (ref 96–106)
GFR, EST AFRICAN AMERICAN: 87 mL/min/{1.73_m2} (ref >59)
GFR, EST NON AFRICAN AMERICAN: 75 mL/min/{1.73_m2} (ref >59)
Glucose: 103 mg/dL — ABNORMAL HIGH (ref 65–99)
Phosphorus: 3.8 mg/dL (ref 2.5–4.5)
Potassium: 4.8 mmol/L (ref 3.5–5.2)
Sodium: 131 mmol/L — ABNORMAL LOW (ref 134–144)

## 2017-07-25 MED ORDER — SPIRONOLACTONE 50 MG PO TABS: 50.0000 mg | ORAL_TABLET | Freq: Every day | ORAL | 1 refills | Status: DC

## 2017-07-25 NOTE — Telephone Encounter (Signed)
-----   Message from Birdie Sons, MD sent at 07/25/2017  8:34 AM EDT ----- Kidney functions and electrolytes are good. Increase spironolactone to 50mg  once a day. Follow up o.v. In 3-4 weeks for BP.

## 2017-07-25 NOTE — Telephone Encounter (Signed)
Pt advised. RX sent in for new dose and appointment made-aa

## 2017-07-30 DIAGNOSIS — H2513 Age-related nuclear cataract, bilateral: Secondary | ICD-10-CM | POA: Diagnosis not present

## 2017-08-06 ENCOUNTER — Encounter: Payer: Self-pay | Admitting: *Deleted

## 2017-08-09 ENCOUNTER — Other Ambulatory Visit: Payer: Self-pay | Admitting: Family Medicine

## 2017-08-09 MED ORDER — VERAPAMIL HCL ER 120 MG PO CP24: 120.0000 mg | ORAL_CAPSULE | Freq: Every day | ORAL | 4 refills | Status: DC

## 2017-08-09 NOTE — Telephone Encounter (Signed)
[facility] faxed a refill request on the following medications:  verapamil (VERELAN PM) 120 MG 24 hr capsule.  Take 1 capsule daily.  90 day supply.  [facility] mail order/MW

## 2017-08-12 NOTE — Discharge Instructions (Signed)

## 2017-08-13 ENCOUNTER — Ambulatory Visit
Admission: RE | Admit: 2017-08-13 | Discharge: 2017-08-13 | Disposition: A | Discharge status: Home / Self Care | Payer: Medicare Other | Source: Ambulatory Visit | Attending: Ophthalmology | Admitting: Ophthalmology

## 2017-08-13 ENCOUNTER — Telehealth: Payer: Self-pay

## 2017-08-13 ENCOUNTER — Encounter
Admission: RE | Disposition: A | Discharge status: Home / Self Care | Payer: Self-pay | Source: Ambulatory Visit | Attending: Ophthalmology

## 2017-08-13 ENCOUNTER — Ambulatory Visit: Payer: Medicare Other | Admitting: Anesthesiology

## 2017-08-13 DIAGNOSIS — I10 Essential (primary) hypertension: Secondary | ICD-10-CM | POA: Diagnosis not present

## 2017-08-13 DIAGNOSIS — Z7982 Long term (current) use of aspirin: Secondary | ICD-10-CM | POA: Insufficient documentation

## 2017-08-13 DIAGNOSIS — H9193 Unspecified hearing loss, bilateral: Secondary | ICD-10-CM | POA: Insufficient documentation

## 2017-08-13 DIAGNOSIS — Z85828 Personal history of other malignant neoplasm of skin: Secondary | ICD-10-CM | POA: Diagnosis not present

## 2017-08-13 DIAGNOSIS — H2512 Age-related nuclear cataract, left eye: Secondary | ICD-10-CM | POA: Diagnosis not present

## 2017-08-13 DIAGNOSIS — Z79899 Other long term (current) drug therapy: Secondary | ICD-10-CM | POA: Insufficient documentation

## 2017-08-13 DIAGNOSIS — H2513 Age-related nuclear cataract, bilateral: Secondary | ICD-10-CM | POA: Diagnosis not present

## 2017-08-13 HISTORY — DX: Presence of external hearing-aid: Z97.4

## 2017-08-13 HISTORY — PX: CATARACT EXTRACTION W/PHACO: SHX586

## 2017-08-13 SURGERY — PHACOEMULSIFICATION, CATARACT, WITH IOL INSERTION
Anesthesia: Monitor Anesthesia Care | Site: Eye | Laterality: Left | Wound class: Clean

## 2017-08-13 MED ORDER — FENTANYL CITRATE (PF) 100 MCG/2ML IJ SOLN
INTRAMUSCULAR | Status: DC | PRN
  Administered 2017-08-13: 50 ug via INTRAVENOUS

## 2017-08-13 MED ORDER — ACETAMINOPHEN 325 MG PO TABS: 325.0000 mg | ORAL_TABLET | Freq: Once | ORAL | Status: DC

## 2017-08-13 MED ORDER — LACTATED RINGERS IV SOLN: INTRAVENOUS | Status: DC

## 2017-08-13 MED ORDER — MOXIFLOXACIN HCL 0.5 % OP SOLN
OPHTHALMIC | Status: DC | PRN
  Administered 2017-08-13: 0.2 mL via OPHTHALMIC

## 2017-08-13 MED ORDER — SODIUM HYALURONATE 23 MG/ML IO SOLN
INTRAOCULAR | Status: DC | PRN
  Administered 2017-08-13: 0.6 mL via INTRAOCULAR

## 2017-08-13 MED ORDER — ACETAMINOPHEN 160 MG/5ML PO SOLN: 325.0000 mg | Freq: Once | ORAL | Status: DC

## 2017-08-13 MED ORDER — ARMC OPHTHALMIC DILATING DROPS
1.0000 "application " | OPHTHALMIC | Status: DC | PRN
  Administered 2017-08-13 (×3): 1 via OPHTHALMIC

## 2017-08-13 MED ORDER — MIDAZOLAM HCL 2 MG/2ML IJ SOLN
INTRAMUSCULAR | Status: DC | PRN
  Administered 2017-08-13: 2 mg via INTRAVENOUS

## 2017-08-13 MED ORDER — EPINEPHRINE PF 1 MG/ML IJ SOLN
INTRAOCULAR | Status: DC | PRN
  Administered 2017-08-13: 96 mL via OPHTHALMIC

## 2017-08-13 MED ORDER — SODIUM HYALURONATE 10 MG/ML IO SOLN
INTRAOCULAR | Status: DC | PRN
  Administered 2017-08-13: 0.55 mL via INTRAOCULAR

## 2017-08-13 MED ORDER — DILTIAZEM HCL ER COATED BEADS 120 MG PO CP24: 120.0000 mg | ORAL_CAPSULE | Freq: Every day | ORAL | 3 refills | Status: DC

## 2017-08-13 MED ORDER — LIDOCAINE HCL (PF) 2 % IJ SOLN
INTRAOCULAR | Status: DC | PRN
  Administered 2017-08-13: 1 mL via INTRAOCULAR

## 2017-08-13 SURGICAL SUPPLY — 16 items
CANNULA ANT/CHMB 27GA (MISCELLANEOUS) ×3 IMPLANT
DISSECTOR HYDRO NUCLEUS 50X22 (MISCELLANEOUS) ×3 IMPLANT
GLOVE BIO SURGEON STRL SZ8 (GLOVE) ×3 IMPLANT
GLOVE SURG LX 7.5 STRW (GLOVE) ×2
GLOVE SURG LX STRL 7.5 STRW (GLOVE) ×1 IMPLANT
GOWN STRL REUS W/ TWL LRG LVL3 (GOWN DISPOSABLE) ×2 IMPLANT
GOWN STRL REUS W/TWL LRG LVL3 (GOWN DISPOSABLE) ×4
LENS IOL TECNIS ITEC 21.0 (Intraocular Lens) ×3 IMPLANT
MARKER SKIN DUAL TIP RULER LAB (MISCELLANEOUS) ×3 IMPLANT
PACK CATARACT (MISCELLANEOUS) ×3 IMPLANT
PACK DR. KING ARMS (PACKS) ×3 IMPLANT
PACK EYE AFTER SURG (MISCELLANEOUS) ×3 IMPLANT
SYR 3ML LL SCALE MARK (SYRINGE) ×3 IMPLANT
SYR TB 1ML LUER SLIP (SYRINGE) ×3 IMPLANT
WATER STERILE IRR 500ML POUR (IV SOLUTION) ×3 IMPLANT
WIPE NON LINTING 3.25X3.25 (MISCELLANEOUS) ×3 IMPLANT

## 2017-08-13 NOTE — Telephone Encounter (Signed)
T.C from [facility] is requesting a callback due to Verapail SR 120 mg on back order. CB# [phone]. Reference # [id number]

## 2017-08-13 NOTE — Telephone Encounter (Signed)
Do they have any alternative formulations, or do they have the 180mg  24 hour verapamil available?

## 2017-08-13 NOTE — Op Note (Signed)
OPERATIVE NOTE  KEYSTON ARDOLINO [id number] 08/13/2017   PREOPERATIVE DIAGNOSIS:  Nuclear sclerotic cataract left eye.  H25.12   POSTOPERATIVE DIAGNOSIS:    Nuclear sclerotic cataract left eye.     PROCEDURE:  Phacoemusification with posterior chamber intraocular lens placement of the left eye   LENS:   Implant Name Type Inv. Item Serial No. Manufacturer Lot No. LRB No. Used  LENS IOL DIOP 21.0 - [id number] Intraocular Lens LENS IOL DIOP 21.0 [id number]   Left 1       PCB00 +21.0   ULTRASOUND TIME: 0 minutes 57.8 seconds.  CDE 11.08   SURGEON:  Benay Pillow, MD, MPH   ANESTHESIA:  Topical with tetracaine drops augmented with 1% preservative-free intracameral lidocaine.  ESTIMATED BLOOD LOSS: <1 mL   COMPLICATIONS:  None.   DESCRIPTION OF PROCEDURE:  The patient was identified in the holding room and transported to the operating room and placed in the supine position under the operating microscope.  The left eye was identified as the operative eye and it was prepped and draped in the usual sterile ophthalmic fashion.   A 1.0 millimeter clear-corneal paracentesis was made at the 5:00 position. 0.5 ml of preservative-free 1% lidocaine with epinephrine was injected into the anterior chamber.  The anterior chamber was filled with Healon 5 viscoelastic.  A 2.4 millimeter keratome was used to make a near-clear corneal incision at the 2:00 position.  A curvilinear capsulorrhexis was made with a cystotome and capsulorrhexis forceps.  Balanced salt solution was used to hydrodissect and hydrodelineate the nucleus.   Phacoemulsification was then used in stop and chop fashion to remove the lens nucleus and epinucleus.  The remaining cortex was then removed using the irrigation and aspiration handpiece. Healon was then placed into the capsular bag to distend it for lens placement.  A lens was then injected into the capsular bag.  The remaining viscoelastic was aspirated.   Wounds were  hydrated with balanced salt solution.  The anterior chamber was inflated to a physiologic pressure with balanced salt solution.  Intracameral vigamox 0.1 mL undiltued was injected into the eye and a drop placed onto the ocular surface.  No wound leaks were noted.  The patient was taken to the recovery room in stable condition without complications of anesthesia or surgery  Benay Pillow 08/13/2017, 10:36 AM

## 2017-08-13 NOTE — Anesthesia Postprocedure Evaluation (Signed)
Anesthesia Post Note  Patient: Travis Austin  Procedure(s) Performed: Procedure(s) (LRB): CATARACT EXTRACTION PHACO AND INTRAOCULAR LENS PLACEMENT (IOC) LEFT (Left)  Patient location during evaluation: PACU Anesthesia Type: MAC Level of consciousness: awake and alert and oriented Pain management: satisfactory to patient Vital Signs Assessment: post-procedure vital signs reviewed and stable Respiratory status: spontaneous breathing, nonlabored ventilation and respiratory function stable Cardiovascular status: blood pressure returned to baseline and stable Postop Assessment: Adequate PO intake and No signs of nausea or vomiting Anesthetic complications: no    Raliegh Ip

## 2017-08-13 NOTE — Anesthesia Procedure Notes (Signed)
Procedure Name: MAC Performed by: Laura Radilla Pre-anesthesia Checklist: Patient identified, Emergency Drugs available, Suction available, Timeout performed and Patient being monitored Patient Re-evaluated:Patient Re-evaluated prior to inductionOxygen Delivery Method: Nasal cannula Placement Confirmation: positive ETCO2     

## 2017-08-13 NOTE — Anesthesia Preprocedure Evaluation (Signed)
Anesthesia Evaluation  Patient identified by MRN, date of birth, ID band Patient awake    Reviewed: Allergy & Precautions, H&P , NPO status , Patient's Chart, lab work & pertinent test results  Airway Mallampati: II  TM Distance: >3 FB Neck ROM: full    Dental no notable dental hx.    Pulmonary    Pulmonary exam normal breath sounds clear to auscultation       Cardiovascular hypertension, Normal cardiovascular exam Rhythm:regular Rate:Normal     Neuro/Psych    GI/Hepatic   Endo/Other    Renal/GU      Musculoskeletal   Abdominal   Peds  Hematology   Anesthesia Other Findings   Reproductive/Obstetrics                             Anesthesia Physical Anesthesia Plan  ASA: II  Anesthesia Plan: MAC   Post-op Pain Management:    Induction:   PONV Risk Score and Plan: 1 and Midazolam  Airway Management Planned:   Additional Equipment:   Intra-op Plan:   Post-operative Plan:   Informed Consent: I have reviewed the patients History and Physical, chart, labs and discussed the procedure including the risks, benefits and alternatives for the proposed anesthesia with the patient or authorized representative who has indicated his/her understanding and acceptance.     Plan Discussed with: CRNA  Anesthesia Plan Comments:         Anesthesia Quick Evaluation

## 2017-08-13 NOTE — Transfer of Care (Signed)
Immediate Anesthesia Transfer of Care Note  Patient: Travis Austin  Procedure(s) Performed: Procedure(s): CATARACT EXTRACTION PHACO AND INTRAOCULAR LENS PLACEMENT (IOC) LEFT (Left)  Patient Location: PACU  Anesthesia Type: MAC  Level of Consciousness: awake, alert  and patient cooperative  Airway and Oxygen Therapy: Patient Spontanous Breathing and Patient connected to supplemental oxygen  Post-op Assessment: Post-op Vital signs reviewed, Patient's Cardiovascular Status Stable, Respiratory Function Stable, Patent Airway and No signs of Nausea or vomiting  Post-op Vital Signs: Reviewed and stable  Complications: No apparent anesthesia complications

## 2017-08-13 NOTE — Telephone Encounter (Signed)
Did you want the patient to start something different? Please advise. Thanks!

## 2017-08-13 NOTE — Telephone Encounter (Signed)
They only have Verapamil 120mg , 180mg , and 240mg  12hr capsules available. The pharmacist reports that all other formulations are on back order.

## 2017-08-13 NOTE — H&P (Signed)
The History and Physical notes are on paper, have been signed, and are to be scanned.   I have examined the patient and there are no changes to the H&P.   Benay Pillow 08/13/2017 9:59 AM

## 2017-08-13 NOTE — Telephone Encounter (Signed)
Have sent prescription for diltiazem to take in its place. Please advise patient of medication change. It should work the same as the verapamil.

## 2017-08-14 ENCOUNTER — Encounter: Payer: Self-pay | Admitting: Ophthalmology

## 2017-08-14 ENCOUNTER — Telehealth: Payer: Self-pay

## 2017-08-14 NOTE — Telephone Encounter (Signed)
Per patient he wants to put the diltiazem rx on hold for now. Patient stated that he has enough verapamil to last until his ov 08/21/2017. Patient stated that he will discuss changes with provider at appt. I notified [facility] to hold diltiazem rx for now.

## 2017-08-14 NOTE — Telephone Encounter (Signed)
I spoke with [facility] regarding putting the Diltiazem on hold. They states that the prescription will be cancelled and Dr. Caryn Section will need to send in a new prescription when patient is ready for a refill. Verapamil was also cancelled.

## 2017-08-21 ENCOUNTER — Encounter: Payer: Self-pay | Admitting: Family Medicine

## 2017-08-21 ENCOUNTER — Ambulatory Visit (INDEPENDENT_AMBULATORY_CARE_PROVIDER_SITE_OTHER): Payer: Medicare Other | Admitting: Family Medicine

## 2017-08-21 VITALS — BP 142/84 | HR 58 | Temp 97.7°F | Resp 16 | Ht 68.0 in | Wt 161.0 lb

## 2017-08-21 DIAGNOSIS — I10 Essential (primary) hypertension: Secondary | ICD-10-CM

## 2017-08-21 DIAGNOSIS — Z23 Encounter for immunization: Secondary | ICD-10-CM

## 2017-08-21 MED ORDER — SPIRONOLACTONE 50 MG PO TABS: 50.0000 mg | ORAL_TABLET | Freq: Every day | ORAL | 2 refills | Status: DC

## 2017-08-21 NOTE — Progress Notes (Signed)
       Patient: Travis Austin Male    DOB: [birth year]   83 y.o.   MRN: [id number] Visit Date: 08/21/2017  Today's Provider: Lelon Huh, MD   Chief Complaint  Patient presents with  . Follow-up  . Hypertension   Subjective:    HPI  Hypertension, follow-up:  BP Readings from Last 3 Encounters:  08/21/17 (!) 150/80  08/13/17 129/76  07/24/17 (!) 150/96    He was last seen for hypertension 1 months ago.  BP at that visit was 150/96. Management since that visit includes; increased spironolactone to 50 mg qd.He reports good compliance with treatment. He is not having side effects. none He is exercising. He is adherent to low salt diet.   Outside blood pressures are 130/74. He is experiencing none.  Patient denies none.   Cardiovascular risk factors include none.  Use of agents associated with hypertension: none.   ----------------------------------------------------------------     Allergies  Allergen Reactions  . Alpha-Gal     (meat allergy from tick bite)     Current Outpatient Prescriptions:  .  aspirin 81 MG tablet, Take 1 tablet by mouth daily., Disp: , Rfl:  .  Multiple Vitamins-Minerals (MULTIVITAMIN ADULT PO), Take 1 tablet by mouth daily., Disp: , Rfl:  .  spironolactone (ALDACTONE) 50 MG tablet, Take 1 tablet (50 mg total) by mouth daily., Disp: 30 tablet, Rfl: 1 .  diltiazem (CARDIZEM CD) 120 MG 24 hr capsule, Take 1 capsule (120 mg total) by mouth daily. (Patient not taking: Reported on 08/21/2017), Disp: 90 capsule, Rfl: 3  Review of Systems  Constitutional: Negative for appetite change, chills and fever.  Respiratory: Negative for chest tightness, shortness of breath and wheezing.   Cardiovascular: Negative for chest pain and palpitations.  Gastrointestinal: Negative for abdominal pain, nausea and vomiting.    Social History  Substance Use Topics  . Smoking status: Never Smoker  . Smokeless tobacco: Never Used  . Alcohol use 4.2 oz/week    7  Glasses of wine per week   Objective:    Vitals:   08/21/17 1510 08/21/17 1528  BP: (!) 150/80 (!) 142/84  Pulse: (!) 58   Resp: 16   Temp: 97.7 F (36.5 C)   TempSrc: Oral   SpO2: 97%   Weight: 161 lb (73 kg)   Height: 5\' 8"  (1.727 m)      Physical Exam  General appearance: alert, well developed, well nourished, cooperative and in no distress Head: Normocephalic, without obvious abnormality, atraumatic Respiratory: Respirations even and unlabored, normal respiratory rate Extremities: No gross deformities Skin: Skin color, texture, turgor normal. No rashes seen  Psych: Appropriate mood and affect. Neurologic: Mental status: Alert, oriented to person, place, and time, thought content appropriate.     Assessment & Plan:     1. Essential hypertension Improved with increased dose of spironolactone.  - spironolactone (ALDACTONE) 50 MG tablet; Take 1 tablet (50 mg total) by mouth daily.  Dispense: 90 tablet; Refill: 2  2. Need for influenza vaccination  - Flu vaccine HIGH DOSE PF  Return in about 4 months (around 12/21/2017).       Lelon Huh, MD  [facility]

## 2017-09-05 DIAGNOSIS — L853 Xerosis cutis: Secondary | ICD-10-CM | POA: Diagnosis not present

## 2017-09-05 DIAGNOSIS — R21 Rash and other nonspecific skin eruption: Secondary | ICD-10-CM | POA: Diagnosis not present

## 2017-09-05 DIAGNOSIS — L2089 Other atopic dermatitis: Secondary | ICD-10-CM | POA: Diagnosis not present

## 2017-09-12 ENCOUNTER — Telehealth: Payer: Self-pay | Admitting: Family Medicine

## 2017-09-12 NOTE — Telephone Encounter (Addendum)
[facility] for the following medication. Thanks CC  spironolactone (ALDACTONE) 50 MG tablet  >Take 1 tablet by mouth every day.

## 2017-09-12 NOTE — Telephone Encounter (Signed)
Last filled 08/21/17 with 2 refills. KW

## 2017-09-13 ENCOUNTER — Other Ambulatory Visit: Payer: Self-pay | Admitting: Family Medicine

## 2017-09-13 DIAGNOSIS — I10 Essential (primary) hypertension: Secondary | ICD-10-CM

## 2017-09-13 MED ORDER — SPIRONOLACTONE 50 MG PO TABS: 50.0000 mg | ORAL_TABLET | Freq: Every day | ORAL | 2 refills | Status: DC

## 2017-09-13 NOTE — Telephone Encounter (Signed)
[facility] supply for the following medication. Thanks CC  spironolactone (ALDACTONE) 50 MG tablet  >Take 1 tablet by mouth every day.

## 2017-10-16 DIAGNOSIS — H2511 Age-related nuclear cataract, right eye: Secondary | ICD-10-CM | POA: Diagnosis not present

## 2017-10-29 ENCOUNTER — Encounter: Payer: Self-pay | Admitting: *Deleted

## 2017-10-31 ENCOUNTER — Ambulatory Visit: Payer: Medicare Other | Admitting: Certified Registered Nurse Anesthetist

## 2017-10-31 ENCOUNTER — Ambulatory Visit
Admission: RE | Admit: 2017-10-31 | Discharge: 2017-10-31 | Disposition: A | Discharge status: Home / Self Care | Payer: Medicare Other | Source: Ambulatory Visit | Attending: Ophthalmology | Admitting: Ophthalmology

## 2017-10-31 ENCOUNTER — Encounter: Payer: Self-pay | Admitting: Emergency Medicine

## 2017-10-31 ENCOUNTER — Encounter
Admission: RE | Disposition: A | Discharge status: Home / Self Care | Payer: Self-pay | Source: Ambulatory Visit | Attending: Ophthalmology

## 2017-10-31 DIAGNOSIS — Z9849 Cataract extraction status, unspecified eye: Secondary | ICD-10-CM | POA: Insufficient documentation

## 2017-10-31 DIAGNOSIS — Z7982 Long term (current) use of aspirin: Secondary | ICD-10-CM | POA: Diagnosis not present

## 2017-10-31 DIAGNOSIS — Z86010 Personal history of colonic polyps: Secondary | ICD-10-CM | POA: Diagnosis not present

## 2017-10-31 DIAGNOSIS — I10 Essential (primary) hypertension: Secondary | ICD-10-CM | POA: Diagnosis not present

## 2017-10-31 DIAGNOSIS — H9193 Unspecified hearing loss, bilateral: Secondary | ICD-10-CM | POA: Insufficient documentation

## 2017-10-31 DIAGNOSIS — Z85828 Personal history of other malignant neoplasm of skin: Secondary | ICD-10-CM | POA: Diagnosis not present

## 2017-10-31 DIAGNOSIS — Z79899 Other long term (current) drug therapy: Secondary | ICD-10-CM | POA: Diagnosis not present

## 2017-10-31 DIAGNOSIS — H2511 Age-related nuclear cataract, right eye: Secondary | ICD-10-CM | POA: Insufficient documentation

## 2017-10-31 HISTORY — DX: Malignant (primary) neoplasm, unspecified: C80.1

## 2017-10-31 HISTORY — PX: CATARACT EXTRACTION W/PHACO: SHX586

## 2017-10-31 HISTORY — DX: Cardiac murmur, unspecified: R01.1

## 2017-10-31 SURGERY — PHACOEMULSIFICATION, CATARACT, WITH IOL INSERTION
Anesthesia: Monitor Anesthesia Care | Site: Eye | Laterality: Right | Wound class: Clean

## 2017-10-31 MED ORDER — ARMC OPHTHALMIC DILATING DROPS
OPHTHALMIC | Status: AC
Start: 2017-10-31 — End: 2017-10-31
  Administered 2017-10-31: 1 via OPHTHALMIC
  Filled 2017-10-31: qty 0.4

## 2017-10-31 MED ORDER — POVIDONE-IODINE 5 % OP SOLN
OPHTHALMIC | Status: DC | PRN
  Administered 2017-10-31: 1 via OPHTHALMIC

## 2017-10-31 MED ORDER — POVIDONE-IODINE 5 % OP SOLN
OPHTHALMIC | Status: AC
  Filled 2017-10-31: qty 30

## 2017-10-31 MED ORDER — SODIUM HYALURONATE 10 MG/ML IO SOLN
INTRAOCULAR | Status: DC | PRN
  Administered 2017-10-31: 0.55 mL via INTRAOCULAR

## 2017-10-31 MED ORDER — ARMC OPHTHALMIC DILATING DROPS
1.0000 "application " | OPHTHALMIC | Status: AC
  Administered 2017-10-31 (×4): 1 via OPHTHALMIC

## 2017-10-31 MED ORDER — SODIUM CHLORIDE 0.9 % IV SOLN
INTRAVENOUS | Status: DC
  Administered 2017-10-31: 07:00:00 via INTRAVENOUS

## 2017-10-31 MED ORDER — LIDOCAINE HCL (PF) 4 % IJ SOLN
INTRAMUSCULAR | Status: AC
  Filled 2017-10-31: qty 5

## 2017-10-31 MED ORDER — MOXIFLOXACIN HCL 0.5 % OP SOLN: 1.0000 [drp] | OPHTHALMIC | Status: DC | PRN

## 2017-10-31 MED ORDER — MOXIFLOXACIN HCL 0.5 % OP SOLN
OPHTHALMIC | Status: AC
  Filled 2017-10-31: qty 3

## 2017-10-31 MED ORDER — FENTANYL CITRATE (PF) 100 MCG/2ML IJ SOLN
INTRAMUSCULAR | Status: AC
  Filled 2017-10-31: qty 2

## 2017-10-31 MED ORDER — SODIUM HYALURONATE 23 MG/ML IO SOLN
INTRAOCULAR | Status: DC | PRN
Start: 2017-10-31 — End: 2017-10-31
  Administered 2017-10-31: 0.6 mL via INTRAOCULAR

## 2017-10-31 MED ORDER — BSS IO SOLN
INTRAOCULAR | Status: DC | PRN
  Administered 2017-10-31: 1 via INTRAOCULAR

## 2017-10-31 MED ORDER — MOXIFLOXACIN HCL 0.5 % OP SOLN
OPHTHALMIC | Status: DC | PRN
  Administered 2017-10-31: 0.2 mL via OPHTHALMIC

## 2017-10-31 MED ORDER — FENTANYL CITRATE (PF) 100 MCG/2ML IJ SOLN
INTRAMUSCULAR | Status: DC | PRN
  Administered 2017-10-31: 50 ug via INTRAVENOUS

## 2017-10-31 MED ORDER — SODIUM HYALURONATE 23 MG/ML IO SOLN
INTRAOCULAR | Status: AC
  Filled 2017-10-31: qty 0.6

## 2017-10-31 MED ORDER — LIDOCAINE HCL (PF) 4 % IJ SOLN
INTRAOCULAR | Status: DC | PRN
  Administered 2017-10-31: 4 mL via OPHTHALMIC

## 2017-10-31 MED ORDER — EPINEPHRINE PF 1 MG/ML IJ SOLN
INTRAMUSCULAR | Status: AC
  Filled 2017-10-31: qty 1

## 2017-10-31 SURGICAL SUPPLY — 16 items
DISSECTOR HYDRO NUCLEUS 50X22 (MISCELLANEOUS) ×2 IMPLANT
GLOVE BIO SURGEON STRL SZ8 (GLOVE) ×2 IMPLANT
GLOVE BIOGEL M 6.5 STRL (GLOVE) ×2 IMPLANT
GLOVE SURG LX 7.5 STRW (GLOVE) ×1
GLOVE SURG LX STRL 7.5 STRW (GLOVE) ×1 IMPLANT
GOWN STRL REUS W/ TWL LRG LVL3 (GOWN DISPOSABLE) ×2 IMPLANT
GOWN STRL REUS W/TWL LRG LVL3 (GOWN DISPOSABLE) ×2
LABEL CATARACT MEDS ST (LABEL) ×2 IMPLANT
LENS IOL TECNIS ITEC 18.5 (Intraocular Lens) ×2 IMPLANT
PACK CATARACT (MISCELLANEOUS) ×2 IMPLANT
PACK CATARACT KING (MISCELLANEOUS) ×2 IMPLANT
PACK EYE AFTER SURG (MISCELLANEOUS) ×2 IMPLANT
SOL BSS BAG (MISCELLANEOUS) ×2
SOLUTION BSS BAG (MISCELLANEOUS) ×1 IMPLANT
WATER STERILE IRR 250ML POUR (IV SOLUTION) ×2 IMPLANT
WIPE NON LINTING 3.25X3.25 (MISCELLANEOUS) ×2 IMPLANT

## 2017-10-31 NOTE — Discharge Instructions (Signed)
Eye Surgery Discharge Instructions  Expect mild scratchy sensation or mild soreness. DO NOT RUB YOUR EYE!  The day of surgery:  Minimal physical activity, but bed rest is not required  No reading, computer work, or close hand work  No bending, lifting, or straining.  May watch TV  For 24 hours:  No driving, legal decisions, or alcoholic beverages  Safety precautions  Eat anything you prefer: It is better to start with liquids, then soup then solid foods.  _____ Eye patch should be worn until postoperative exam tomorrow.  ____ Solar shield eyeglasses should be worn for comfort in the sunlight/patch while sleeping  Resume all regular medications including aspirin or Coumadin if these were discontinued prior to surgery. You may shower, bathe, shave, or wash your hair. Tylenol may be taken for mild discomfort.  Call your doctor if you experience significant pain, nausea, or vomiting, fever > 101 or other signs of infection. [phone] or [phone] Specific instructions:  Follow-up Information    Eulogio Bear, MD Follow up.   Specialty:  Ophthalmology Why:  November 16 at 10:40am in [facility] information: [address] [place] [address] [phone]

## 2017-10-31 NOTE — H&P (Signed)
The History and Physical notes are on paper, have been signed, and are to be scanned.   I have examined the patient and there are no changes to the H&P.   Travis Austin 10/31/2017 8:12 AM

## 2017-10-31 NOTE — Anesthesia Post-op Follow-up Note (Signed)
Anesthesia QCDR form completed.        

## 2017-10-31 NOTE — Transfer of Care (Signed)
Immediate Anesthesia Transfer of Care Note  Patient: Travis Austin  Procedure(s) Performed: CATARACT EXTRACTION PHACO AND INTRAOCULAR LENS PLACEMENT (IOC) (Right Eye)  Patient Location: PACU  Anesthesia Type:MAC  Level of Consciousness: awake, alert  and oriented  Airway & Oxygen Therapy: Patient Spontanous Breathing  Post-op Assessment: Report given to RN and Post -op Vital signs reviewed and stable  Post vital signs: Reviewed and stable  Last Vitals:  Vitals:   10/31/17 0646  BP: (!) 185/97  Pulse: 65  Resp: 18  Temp: (!) 36.4 C  SpO2: 94%    Last Pain:  Vitals:   10/31/17 0646  TempSrc: Tympanic         Complications: No apparent anesthesia complications

## 2017-10-31 NOTE — Anesthesia Preprocedure Evaluation (Signed)
Anesthesia Evaluation  Patient identified by MRN, date of birth, ID band Patient awake    Reviewed: Allergy & Precautions, NPO status , Patient's Chart, lab work & pertinent test results  History of Anesthesia Complications Negative for: history of anesthetic complications  Airway Mallampati: II  TM Distance: >3 FB Neck ROM: Full    Dental  (+) Caps   Pulmonary neg pulmonary ROS, neg sleep apnea, neg COPD,    breath sounds clear to auscultation- rhonchi (-) wheezing      Cardiovascular hypertension, Pt. on medications (-) CAD, (-) Past MI and (-) Cardiac Stents  Rhythm:Regular Rate:Normal - Systolic murmurs and - Diastolic murmurs    Neuro/Psych  Headaches, negative psych ROS   GI/Hepatic negative GI ROS, Neg liver ROS,   Endo/Other  negative endocrine ROSneg diabetes  Renal/GU negative Renal ROS     Musculoskeletal negative musculoskeletal ROS (+)   Abdominal (+) - obese,   Peds  Hematology negative hematology ROS (+)   Anesthesia Other Findings Past Medical History: No date: Cancer ([place])     Comment:  SKIN No date: Hearing aid worn     Comment:  bilateral No date: Heart murmur     Comment:  CHILDHOOD No date: History of adenomatous polyp of colon No date: Hypertension   Reproductive/Obstetrics                             Anesthesia Physical Anesthesia Plan  ASA: II  Anesthesia Plan: MAC   Post-op Pain Management:    Induction: Intravenous  PONV Risk Score and Plan: 1 and Midazolam  Airway Management Planned: Natural Airway  Additional Equipment:   Intra-op Plan:   Post-operative Plan:   Informed Consent: I have reviewed the patients History and Physical, chart, labs and discussed the procedure including the risks, benefits and alternatives for the proposed anesthesia with the patient or authorized representative who has indicated his/her understanding and acceptance.      Plan Discussed with: CRNA and Anesthesiologist  Anesthesia Plan Comments:         Anesthesia Quick Evaluation

## 2017-10-31 NOTE — Anesthesia Procedure Notes (Signed)
Procedure Name: MAC Performed by: Demetrius Charity, CRNA Pre-anesthesia Checklist: Emergency Drugs available, Patient identified, Suction available, Patient being monitored and Timeout performed Oxygen Delivery Method: Nasal cannula

## 2017-10-31 NOTE — Op Note (Signed)
OPERATIVE NOTE  Travis Austin [id number] 10/31/2017   PREOPERATIVE DIAGNOSIS:  Nuclear sclerotic cataract right eye.  H25.11   POSTOPERATIVE DIAGNOSIS:    Nuclear sclerotic cataract right eye.     PROCEDURE:  Phacoemusification with posterior chamber intraocular lens placement of the right eye   LENS:   Implant Name Type Inv. Item Serial No. Manufacturer Lot No. LRB No. Used  LENS IOL DIOP 18.5 - [id number] Intraocular Lens LENS IOL DIOP 18.5 [id number] AMO  Right 1       PCB00 +18.5   ULTRASOUND TIME: 0 minutes 58 seconds.  CDE 8.11   SURGEON:  Benay Pillow, MD, MPH  ANESTHESIOLOGIST: Anesthesiologist: Gunnar Fusi, MD; Emmie Niemann, MD CRNA: Demetrius Charity, CRNA   ANESTHESIA:  Topical with tetracaine drops augmented with 1% preservative-free intracameral lidocaine.  ESTIMATED BLOOD LOSS: less than 1 mL.   COMPLICATIONS:  None.   DESCRIPTION OF PROCEDURE:  The patient was identified in the holding room and transported to the operating room and placed in the supine position under the operating microscope.  The right eye was identified as the operative eye and it was prepped and draped in the usual sterile ophthalmic fashion.   A 1.0 millimeter clear-corneal paracentesis was made at the 10:30 position. 0.5 ml of preservative-free 1% lidocaine with epinephrine was injected into the anterior chamber.  The anterior chamber was filled with Healon 5 viscoelastic.  A 2.4 millimeter keratome was used to make a near-clear corneal incision at the 8:00 position.  A curvilinear capsulorrhexis was made with a cystotome and capsulorrhexis forceps.  Balanced salt solution was used to hydrodissect and hydrodelineate the nucleus.   Phacoemulsification was then used in stop and chop fashion to remove the lens nucleus and epinucleus.  The remaining cortex was then removed using the irrigation and aspiration handpiece. Healon was then placed into the capsular bag to distend it for lens  placement.  A lens was then injected into the capsular bag.  The remaining viscoelastic was aspirated.   Wounds were hydrated with balanced salt solution.  The anterior chamber was inflated to a physiologic pressure with balanced salt solution.   Intracameral vigamox 0.1 mL undiluted was injected into the eye and a drop placed onto the ocular surface.  No wound leaks were noted.  The patient was taken to the recovery room in stable condition without complications of anesthesia or surgery  Benay Pillow 10/31/2017, 8:49 AM

## 2017-10-31 NOTE — Anesthesia Postprocedure Evaluation (Signed)
Anesthesia Post Note  Patient: Travis Austin  Procedure(s) Performed: CATARACT EXTRACTION PHACO AND INTRAOCULAR LENS PLACEMENT ([place]) (Right Eye)  Patient location during evaluation: PACU Anesthesia Type: MAC Level of consciousness: awake and alert and oriented Pain management: pain level controlled Vital Signs Assessment: post-procedure vital signs reviewed and stable Respiratory status: spontaneous breathing, nonlabored ventilation and respiratory function stable Cardiovascular status: blood pressure returned to baseline and stable Postop Assessment: no signs of nausea or vomiting Anesthetic complications: no     Last Vitals:  Vitals:   10/31/17 0845 10/31/17 0902  BP: (!) 164/79 (!) 156/80  Pulse: 64 61  Resp: 16   Temp: (!) 36.1 C   SpO2: 99%     Last Pain:  Vitals:   10/31/17 0646  TempSrc: Tympanic                 Imonie Tuch

## 2017-11-01 ENCOUNTER — Encounter: Payer: Self-pay | Admitting: Ophthalmology

## 2017-12-24 ENCOUNTER — Ambulatory Visit (INDEPENDENT_AMBULATORY_CARE_PROVIDER_SITE_OTHER): Payer: Medicare Other | Admitting: Family Medicine

## 2017-12-24 VITALS — BP 144/62 | HR 57 | Temp 97.7°F | Resp 16 | Wt 161.0 lb

## 2017-12-24 DIAGNOSIS — I10 Essential (primary) hypertension: Secondary | ICD-10-CM

## 2017-12-24 DIAGNOSIS — E871 Hypo-osmolality and hyponatremia: Secondary | ICD-10-CM | POA: Diagnosis not present

## 2017-12-24 NOTE — Progress Notes (Signed)
Patient: Travis Austin Male    DOB: 25-Jun-1931   84 y.o.   MRN: [id number] Visit Date: 12/24/2017  Today's Provider: Lelon Huh, MD   Chief Complaint  Patient presents with  . Hypertension    follow up   Subjective:    HPI   Hypertension, follow-up:  BP Readings from Last 3 Encounters:  10/31/17 (!) 156/80  08/21/17 (!) 142/84  08/13/17 129/76    He was last seen for hypertension 4 months ago.  BP at that visit was 142/84. Management since that visit includes; no changes.He reports good compliance with treatment. He is not having side effects.  He is exercising. He is adherent to low salt diet.   Outside blood pressures are 130s/70s He is experiencing none.  Patient denies chest pain, chest pressure/discomfort, claudication, dyspnea, exertional chest pressure/discomfort, fatigue, irregular heart beat, lower extremity edema, near-syncope, orthopnea, palpitations, paroxysmal nocturnal dyspnea, syncope and tachypnea.   Cardiovascular risk factors include advanced age (older than 25 for men, 62 for women) and hypertension.  Use of agents associated with hypertension: NSAIDS.   He was previously on lisinopril-hctz but changed to spironolactone in July due to uncontrolled blood pressure and hyponatremia.  ------------------------------------------------------------------------    Allergies  Allergen Reactions  . Alpha-Gal     (meat allergy from tick bite)     Current Outpatient Medications:  .  aspirin EC 81 MG tablet, Take 81 mg daily by mouth., Disp: , Rfl:  .  Multiple Vitamin (MULTIVITAMIN WITH MINERALS) TABS tablet, Take 1 tablet daily by mouth., Disp: , Rfl:  .  spironolactone (ALDACTONE) 50 MG tablet, Take 1 tablet (50 mg total) by mouth daily., Disp: 90 tablet, Rfl: 2 .  verapamil (CALAN-SR) 120 MG CR tablet, Take 120 mg at bedtime by mouth., Disp: , Rfl:   Review of Systems  Constitutional: Negative for appetite change, chills and fever.    Respiratory: Negative for chest tightness, shortness of breath and wheezing.   Cardiovascular: Negative for chest pain and palpitations.  Gastrointestinal: Negative for abdominal pain, nausea and vomiting.    Social History   Tobacco Use  . Smoking status: Never Smoker  . Smokeless tobacco: Never Used  Substance Use Topics  . Alcohol use: Yes    Alcohol/week: 4.2 oz    Types: 7 Glasses of wine per week   Objective:   BP (!) 156/78 (BP Location: Right Arm, Cuff Size: Normal)   Pulse (!) 57   Temp 97.7 F (36.5 C) (Oral)   Resp 16   Wt 161 lb (73 kg)   SpO2 96% Comment: room air  BMI 24.48 kg/m  Vitals:   12/24/17 1343 12/24/17 1350 12/24/17 1408  BP: (!) 158/76 (!) 156/78 (!) 144/62  Pulse: (!) 57    Resp: 16    Temp: 97.7 F (36.5 C)    TempSrc: Oral    SpO2: 96%    Weight: 161 lb (73 kg)       Physical Exam   General Appearance:    Alert, cooperative, no distress  Eyes:    PERRL, conjunctiva/corneas clear, EOM's intact       Lungs:     Clear to auscultation bilaterally, respirations unlabored  Heart:    Regular rate and rhythm  Neurologic:   Awake, alert, oriented x 3. No apparent focal neurological           defect.           Assessment & Plan:  1. Essential hypertension Doing well with change to spironolactone. Mildly elevated systolic BP in office, but he reports readings consistently under 140/80 at home. Continue current medications.     - Renal function panel - TSH  2. Hyponatremia Improved since change from hctz to spironolactone.  - TSH       Lelon Huh, MD  [facility]

## 2017-12-25 LAB — RENAL FUNCTION PANEL
Albumin: 4.4 g/dL (ref 3.5–4.7)
BUN / CREAT RATIO: 21 (ref 10–24)
BUN: 19 mg/dL (ref 8–27)
CALCIUM: 9.5 mg/dL (ref 8.6–10.2)
CHLORIDE: 91 mmol/L — AB (ref 96–106)
CO2: 21 mmol/L (ref 20–29)
Creatinine, Ser: 0.91 mg/dL (ref 0.76–1.27)
GFR calc Af Amer: 89 mL/min/{1.73_m2} (ref >59)
GFR calc non Af Amer: 77 mL/min/{1.73_m2} (ref >59)
GLUCOSE: 100 mg/dL — AB (ref 65–99)
PHOSPHORUS: 3.6 mg/dL (ref 2.5–4.5)
POTASSIUM: 5.2 mmol/L (ref 3.5–5.2)
SODIUM: 128 mmol/L — AB (ref 134–144)

## 2017-12-25 LAB — TSH: TSH: 4.64 u[IU]/mL — AB (ref 0.450–4.500)

## 2017-12-27 ENCOUNTER — Telehealth: Payer: Self-pay

## 2017-12-27 NOTE — Telephone Encounter (Signed)
Pt declined setting up AWV at this time. He states he has a f/u already scheduled for 07/19 and does not want to make another apt. Pt to set up AWV and CPE following next apt.

## 2017-12-31 ENCOUNTER — Telehealth: Payer: Self-pay | Admitting: *Deleted

## 2017-12-31 MED ORDER — LISINOPRIL 20 MG PO TABS: 20.0000 mg | ORAL_TABLET | Freq: Every day | ORAL | 0 refills | Status: DC

## 2017-12-31 MED ORDER — LEVOTHYROXINE SODIUM 25 MCG PO TABS: 25.0000 ug | ORAL_TABLET | Freq: Every day | ORAL | 0 refills | Status: DC

## 2017-12-31 NOTE — Telephone Encounter (Signed)
Patient was notified of results. Expressed understanding. Rx sent to pharmacy. 

## 2017-12-31 NOTE — Telephone Encounter (Signed)
-----   Message from Birdie Sons, MD sent at 12/29/2017  4:35 PM EST ----- Sodium is still very low at 128. Is also hyPOthyroid which can cause low sodium. Recommend change spironolactone to lisinopril 20mg  once a day, #30, rf x 1 and start levothyroxine 34mcg once a day, #30, rf x 1. Follow up 6 weeks.

## 2018-02-11 ENCOUNTER — Ambulatory Visit (INDEPENDENT_AMBULATORY_CARE_PROVIDER_SITE_OTHER): Payer: Medicare Other | Admitting: Family Medicine

## 2018-02-11 ENCOUNTER — Encounter: Payer: Self-pay | Admitting: Family Medicine

## 2018-02-11 VITALS — BP 144/78 | HR 64 | Temp 97.9°F | Resp 16 | Wt 170.0 lb

## 2018-02-11 DIAGNOSIS — E039 Hypothyroidism, unspecified: Secondary | ICD-10-CM | POA: Diagnosis not present

## 2018-02-11 DIAGNOSIS — I10 Essential (primary) hypertension: Secondary | ICD-10-CM | POA: Diagnosis not present

## 2018-02-11 DIAGNOSIS — E871 Hypo-osmolality and hyponatremia: Secondary | ICD-10-CM

## 2018-02-11 NOTE — Progress Notes (Signed)
Patient: Travis Austin Male    DOB: 1931/02/16   84 y.o.   MRN: [id number] Visit Date: 02/11/2018  Today's Provider: Lelon Huh, MD   Chief Complaint  Patient presents with  . Hypothyroidism    6 week follow up   . hyponatremia   Subjective:    HPI Hypothyroidism, follow up: Patient was seen in the office 1 month ago. Labs indicated that the patient is now hypothyroid, and he was started on levothyroxine 49mcg daily. Patient reports good compliance Lab Results  Component Value Date   TSH 4.640 (H) 12/24/2017     Hyponatremia, follow up: BMET    Component Value Date/Time   NA 128 (L) 12/24/2017 1428   K 5.2 12/24/2017 1428   CL 91 (L) 12/24/2017 1428   CO2 21 12/24/2017 1428   GLUCOSE 100 (H) 12/24/2017 1428   BUN 19 12/24/2017 1428   CREATININE 0.91 12/24/2017 1428   CALCIUM 9.5 12/24/2017 1428   GFRNONAA 77 12/24/2017 1428   GFRAA 89 12/24/2017 1428   Patient was advised to discontinue spironolactone 50mg  and start Lisinopril 20mg  daily due to hyponatremia. Patient reports that he is tolerating the medication well without adverse reactions.  Patient has been checking his BP at home, and it is averaging in the 130s/70s but is sometimes higher into the 150s or 60s, and into the 110/60s at other times.     Allergies  Allergen Reactions  . Alpha-Gal     (meat allergy from tick bite)     Current Outpatient Medications:  .  aspirin EC 81 MG tablet, Take 81 mg daily by mouth., Disp: , Rfl:  .  levothyroxine (SYNTHROID, LEVOTHROID) 25 MCG tablet, Take 1 tablet (25 mcg total) by mouth daily before breakfast., Disp: 90 tablet, Rfl: 0 .  lisinopril (PRINIVIL,ZESTRIL) 20 MG tablet, Take 1 tablet (20 mg total) by mouth daily., Disp: 90 tablet, Rfl: 0 .  Multiple Vitamin (MULTIVITAMIN WITH MINERALS) TABS tablet, Take 1 tablet daily by mouth., Disp: , Rfl:  .  verapamil (CALAN-SR) 120 MG CR tablet, Take 120 mg at bedtime by mouth., Disp: , Rfl:  .  spironolactone  (ALDACTONE) 50 MG tablet, Take 1 tablet (50 mg total) by mouth daily. (Patient not taking: Reported on 02/11/2018), Disp: 90 tablet, Rfl: 2  Review of Systems  Constitutional: Negative.   Respiratory: Negative for cough, chest tightness and shortness of breath.   Cardiovascular: Negative for chest pain, palpitations and leg swelling.  Endocrine: Negative.   Musculoskeletal: Negative.   Neurological: Negative for dizziness, weakness, light-headedness, numbness and headaches.    Social History   Tobacco Use  . Smoking status: Never Smoker  . Smokeless tobacco: Never Used  Substance Use Topics  . Alcohol use: Yes    Alcohol/week: 4.2 oz    Types: 7 Glasses of wine per week   Objective:   BP (!) 144/78 (BP Location: Right Arm, Patient Position: Sitting, Cuff Size: Normal)   Pulse 64   Temp 97.9 F (36.6 C)   Resp 16   Wt 170 lb (77.1 kg)   SpO2 97%   BMI 25.85 kg/m  Vitals:   02/11/18 1610  BP: (!) 144/78  Pulse: 64  Resp: 16  Temp: 97.9 F (36.6 C)  SpO2: 97%  Weight: 170 lb (77.1 kg)     Physical Exam  General appearance: alert, well developed, well nourished, cooperative and in no distress Head: Normocephalic, without obvious abnormality, atraumatic Respiratory: Respirations  even and unlabored, normal respiratory rate Extremities: No gross deformities Skin: Skin color, texture, turgor normal. No rashes seen  Psych: Appropriate mood and affect. Neurologic: Mental status: Alert, oriented to person, place, and time, thought content appropriate.     Assessment & Plan:     1. Essential hypertension Fairly well controlled with change to Lisinopril. Continue current medications.   - Renal function panel  2. Hyponatremia Expect improvement with treatment of newly diagnosed hypothyroidism and stopping spironolactone.   - Renal function panel  3. Hypothyroidism, unspecified type Newly diagnosed, tolerating initiation of levothyroxine.  - TSH         Lelon Huh, MD  [facility]

## 2018-02-12 LAB — RENAL FUNCTION PANEL
ALBUMIN: 4.2 g/dL (ref 3.5–4.7)
BUN / CREAT RATIO: 20 (ref 10–24)
BUN: 16 mg/dL (ref 8–27)
CALCIUM: 9.3 mg/dL (ref 8.6–10.2)
CHLORIDE: 93 mmol/L — AB (ref 96–106)
CO2: 24 mmol/L (ref 20–29)
Creatinine, Ser: 0.79 mg/dL (ref 0.76–1.27)
GFR calc Af Amer: 95 mL/min/{1.73_m2} (ref >59)
GFR calc non Af Amer: 82 mL/min/{1.73_m2} (ref >59)
GLUCOSE: 105 mg/dL — AB (ref 65–99)
POTASSIUM: 4.7 mmol/L (ref 3.5–5.2)
Phosphorus: 3.4 mg/dL (ref 2.5–4.5)
Sodium: 132 mmol/L — ABNORMAL LOW (ref 134–144)

## 2018-02-12 LAB — TSH: TSH: 2.18 u[IU]/mL (ref 0.450–4.500)

## 2018-02-16 ENCOUNTER — Other Ambulatory Visit: Payer: Self-pay | Admitting: Family Medicine

## 2018-06-23 ENCOUNTER — Encounter: Payer: Self-pay | Admitting: Family Medicine

## 2018-06-23 ENCOUNTER — Ambulatory Visit (INDEPENDENT_AMBULATORY_CARE_PROVIDER_SITE_OTHER): Payer: Medicare Other | Admitting: Family Medicine

## 2018-06-23 VITALS — BP 142/70 | HR 56 | Temp 97.6°F | Resp 16 | Wt 166.0 lb

## 2018-06-23 DIAGNOSIS — E039 Hypothyroidism, unspecified: Secondary | ICD-10-CM

## 2018-06-23 DIAGNOSIS — E871 Hypo-osmolality and hyponatremia: Secondary | ICD-10-CM

## 2018-06-23 DIAGNOSIS — I10 Essential (primary) hypertension: Secondary | ICD-10-CM | POA: Diagnosis not present

## 2018-06-23 NOTE — Progress Notes (Signed)
       Patient: Travis Austin Male    DOB: May 08, 1931   84 y.o.   MRN: [id number] Visit Date: 06/23/2018  Today's Provider: Lelon Huh, MD   Chief Complaint  Patient presents with  . Hypertension   Subjective:    Hypertension  This is a chronic problem. The problem is controlled (120-130's/60-80's at home). Pertinent negatives include no anxiety, blurred vision, chest pain, headaches, malaise/fatigue, neck pain, orthopnea, palpitations, peripheral edema, PND, shortness of breath or sweats. There are no associated agents to hypertension. Risk factors for coronary artery disease include male gender. There are no compliance problems.   He does have a history of low sodium which has improved since discontinuation of hctz.   Follow up hypothyroid Lab Results  Component Value Date   TSH 2.180 02/11/2018   Feels well on current dose of levothyroxine. Sleeping well. Appetite is normal. No palpitations.     Allergies  Allergen Reactions  . Alpha-Gal     (meat allergy from tick bite)     Current Outpatient Medications:  .  aspirin EC 81 MG tablet, Take 81 mg by mouth every other day. , Disp: , Rfl:  .  lisinopril (PRINIVIL,ZESTRIL) 20 MG tablet, TAKE 1 TABLET DAILY, Disp: 90 tablet, Rfl: 4 .  Multiple Vitamin (MULTIVITAMIN WITH MINERALS) TABS tablet, Take 1 tablet daily by mouth., Disp: , Rfl:  .  SYNTHROID 25 MCG tablet, TAKE 1 TABLET DAILY BEFORE BREAKFAST, Disp: 90 tablet, Rfl: 4 .  verapamil (CALAN-SR) 120 MG CR tablet, Take 120 mg at bedtime by mouth., Disp: , Rfl:   Review of Systems  Constitutional: Negative.  Negative for malaise/fatigue.  Eyes: Negative for blurred vision.  Respiratory: Negative.  Negative for shortness of breath.   Cardiovascular: Negative for chest pain, palpitations, orthopnea, leg swelling and PND.  Gastrointestinal: Negative.   Musculoskeletal: Negative for neck pain.  Neurological: Negative for dizziness, light-headedness and headaches.     Social History   Tobacco Use  . Smoking status: Never Smoker  . Smokeless tobacco: Never Used  Substance Use Topics  . Alcohol use: Yes    Alcohol/week: 4.2 oz    Types: 7 Glasses of wine per week   Objective:    Vitals:   06/23/18 1330 06/23/18 1346  BP: (!) 152/72 (!) 142/70  Pulse: (!) 56   Resp: 16   Temp: 97.6 F (36.4 C)   TempSrc: Oral   Weight: 166 lb (75.3 kg)      Physical Exam   General Appearance:    Alert, cooperative, no distress  Eyes:    PERRL, conjunctiva/corneas clear, EOM's intact       Lungs:     Clear to auscultation bilaterally, respirations unlabored  Heart:    Regular rate and rhythm  Neurologic:   Awake, alert, oriented x 3. No apparent focal neurological           defect.            Assessment & Plan:     1. Essential hypertension Stable Continue current medications.   - Renal function panel  2. Hypothyroidism, unspecified type Doing well continue current thyroid replacement.  - TSH  3. Hyponatremia  - Renal function panel       Lelon Huh, MD  [facility]

## 2018-06-24 ENCOUNTER — Telehealth: Payer: Self-pay

## 2018-06-24 LAB — RENAL FUNCTION PANEL
Albumin: 4.2 g/dL (ref 3.5–4.7)
BUN / CREAT RATIO: 28 — AB (ref 10–24)
BUN: 22 mg/dL (ref 8–27)
CO2: 20 mmol/L (ref 20–29)
CREATININE: 0.79 mg/dL (ref 0.76–1.27)
Calcium: 8.9 mg/dL (ref 8.6–10.2)
Chloride: 97 mmol/L (ref 96–106)
GFR calc non Af Amer: 82 mL/min/{1.73_m2} (ref >59)
GFR, EST AFRICAN AMERICAN: 95 mL/min/{1.73_m2} (ref >59)
Glucose: 94 mg/dL (ref 65–99)
Phosphorus: 3.8 mg/dL (ref 2.5–4.5)
Potassium: 4.7 mmol/L (ref 3.5–5.2)
SODIUM: 132 mmol/L — AB (ref 134–144)

## 2018-06-24 LAB — TSH: TSH: 2.52 u[IU]/mL (ref 0.450–4.500)

## 2018-06-24 NOTE — Telephone Encounter (Signed)
-----   Message from Birdie Sons, MD sent at 06/24/2018  9:08 AM EDT ----- Sodium level is stable at 132. Thyroid functions normal. Continue current medications.  Follow up January as scheduled.

## 2018-06-24 NOTE — Telephone Encounter (Signed)
Patient advised as below. Patient verbalizes understanding and is in agreement with treatment plan.  

## 2018-08-06 ENCOUNTER — Ambulatory Visit (INDEPENDENT_AMBULATORY_CARE_PROVIDER_SITE_OTHER): Payer: Medicare Other

## 2018-08-06 ENCOUNTER — Telehealth: Payer: Self-pay

## 2018-08-06 VITALS — BP 184/80 | HR 68 | Temp 98.3°F | Ht 68.0 in | Wt 165.8 lb

## 2018-08-06 DIAGNOSIS — Z0000 Encounter for general adult medical examination without abnormal findings: Secondary | ICD-10-CM

## 2018-08-06 NOTE — Progress Notes (Signed)
Subjective:   Travis Austin is a 84 y.o. male who presents for Medicare Annual/Subsequent preventive examination.  Review of Systems:  N/A  Cardiac Risk Factors include: advanced age (>55men, >65 women);hypertension;male gender     Objective:    Vitals: BP (!) 184/80 (BP Location: Right Arm)   Pulse 68   Temp 98.3 F (36.8 C) (Oral)   Ht 5\' 8"  (1.727 m)   Wt 165 lb 12.8 oz (75.2 kg)   BMI 25.21 kg/m   Body mass index is 25.21 kg/m.  Advanced Directives 08/06/2018 10/31/2017 08/13/2017 01/23/2017  Does Patient Have a Medical Advance Directive? No No No Yes  Type of Advance Directive - - - Living will;Healthcare Power of [place] in Chart? - - - No - copy requested  Would patient like information on creating a medical advance directive? No - Patient declined No - Patient declined No - Patient declined -    Tobacco Social History   Tobacco Use  Smoking Status Never Smoker  Smokeless Tobacco Never Used     Counseling given: Not Answered   Clinical Intake:  Pre-visit preparation completed: Yes  Pain : No/denies pain Pain Score: 0-No pain     Nutritional Status: BMI 25 -29 Overweight Nutritional Risks: None Diabetes: No  How often do you need to have someone help you when you read instructions, pamphlets, or other written materials from your doctor or pharmacy?: 1 - Never  Interpreter Needed?: No  Information entered by :: [facility], LPN  Past Medical History:  Diagnosis Date  . Cancer ([place])    SKIN  . Hearing aid worn    bilateral  . Heart murmur    CHILDHOOD  . History of adenomatous polyp of colon   . Hypertension    Past Surgical History:  Procedure Laterality Date  . CATARACT EXTRACTION W/PHACO Left 08/13/2017   Procedure: CATARACT EXTRACTION PHACO AND INTRAOCULAR LENS PLACEMENT ([place]) LEFT;  Surgeon: Eulogio Bear, MD;  Location: [place];  Service: Ophthalmology;  Laterality: Left;  .  CATARACT EXTRACTION W/PHACO Right 10/31/2017   Procedure: CATARACT EXTRACTION PHACO AND INTRAOCULAR LENS PLACEMENT ([facility]);  Surgeon: Eulogio Bear, MD;  Location: [facility];  Service: Ophthalmology;  Laterality: Right;  Lot: [id number] J [place]: 00:58.3 AP%: 14.0 CDE: 8.11  . COLONOSCOPY     Family History  Problem Relation Age of Onset  . Arthritis Mother   . Parkinson's disease Mother   . Heart disease Father   . Hypertension Father   . Cancer Brother        prostate   Social History   Socioeconomic History  . Marital status: Widowed    Spouse name: Not on file  . Number of children: 1  . Years of education: Not on file  . Highest education level: Professional school degree (e.g., MD, DDS, DVM, JD)  Occupational History  . Occupation: Retired  Scientific laboratory technician  . Financial resource strain: Not hard at all  . Food insecurity:    Worry: Never true    Inability: Never true  . Transportation needs:    Medical: No    Non-medical: No  Tobacco Use  . Smoking status: Never Smoker  . Smokeless tobacco: Never Used  Substance and Sexual Activity  . Alcohol use: Yes    Alcohol/week: 0.0 standard drinks    Comment: 1 drink a day, any kind  . Drug use: No  . Sexual activity: Not on file  Lifestyle  . Physical activity:    Days per week: Not on file    Minutes per session: Not on file  . Stress: Not at all  Relationships  . Social connections:    Talks on phone: Not on file    Gets together: Not on file    Attends religious service: Not on file    Active member of club or organization: Not on file    Attends meetings of clubs or organizations: Not on file    Relationship status: Not on file  Other Topics Concern  . Not on file  Social History Narrative  . Not on file    Outpatient Encounter Medications as of 08/06/2018  Medication Sig  . aspirin EC 81 MG tablet Take 81 mg by mouth daily.   Marland Kitchen lisinopril (PRINIVIL,ZESTRIL) 20 MG tablet TAKE 1 TABLET DAILY  . Multiple Vitamin  (MULTIVITAMIN WITH MINERALS) TABS tablet Take 1 tablet daily by mouth.  . SYNTHROID 25 MCG tablet TAKE 1 TABLET DAILY BEFORE BREAKFAST  . verapamil (CALAN-SR) 120 MG CR tablet Take 120 mg at bedtime by mouth.   No facility-administered encounter medications on file as of 08/06/2018.     Activities of Daily Living In your present state of health, do you have any difficulty performing the following activities: 08/06/2018 08/13/2017  Hearing? Y N  Comment Wears bilateral hearing aids.  -  Vision? N N  Difficulty concentrating or making decisions? N N  Walking or climbing stairs? Y N  Comment Due to leg pain from stiffness.  -  Dressing or bathing? N N  Doing errands, shopping? N -  Preparing Food and eating ? N -  Using the Toilet? N -  In the past six months, have you accidently leaked urine? N -  Do you have problems with loss of bowel control? N -  Managing your Medications? N -  Managing your Finances? N -  Housekeeping or managing your Housekeeping? N -  Some recent data might be hidden    Patient Care Team: Birdie Sons, MD as PCP - General (Family Medicine) Eulogio Bear, MD as Consulting Physician (Ophthalmology) Ralene Bathe, MD as Consulting Physician (Dermatology)   Assessment:   This is a routine wellness examination for [name].  Exercise Activities and Dietary recommendations Current Exercise Habits: Home exercise routine, Type of exercise: Other - see comments(indoor bicycle), Time (Minutes): 30(to 45 minutes), Frequency (Times/Week): 4, Weekly Exercise (Minutes/Week): 120, Intensity: Mild, Exercise limited by: None identified  Goals    . Increase water intake     Starting 01/23/17, I will drink 3-4 glasses of water a day.       Fall Risk Fall Risk  08/06/2018 12/24/2017 01/23/2017 01/23/2016  Falls in the past year? Yes Yes Yes Yes  Number falls in past yr: 1 1 2  or more 1  Injury with Fall? No No No No  Follow up Falls prevention discussed Falls  evaluation completed;Falls prevention discussed Falls prevention discussed Falls evaluation completed   Is the patient's home free of loose throw rugs in walkways, pet beds, electrical cords, etc?   yes      Grab bars in the bathroom? yes      Handrails on the stairs?   yes      Adequate lighting?   yes  Timed Get Up and Go Performed: N/A  Depression Screen PHQ 2/9 Scores 08/06/2018 02/11/2018 01/23/2017 01/23/2016  PHQ - 2 Score 0 0 0 0  Cognitive Function: Pt declined screening today. Remembered all of memory screening from 2018.         Immunization History  Administered Date(s) Administered  . Influenza, High Dose Seasonal PF 08/21/2017  . Influenza-Unspecified 09/17/2015  . Pneumococcal Conjugate-13 01/21/2015  . Pneumococcal Polysaccharide-23 07/02/2001  . Td 05/29/2004  . Tdap 08/24/2011  . Zoster 11/24/2008    Qualifies for Shingles Vaccine? Due for Shingles vaccine. Declined my offer to administer today. Education has been provided regarding the importance of this vaccine. Pt has been advised to call her insurance company to determine her out of pocket expense. Advised she may also receive this vaccine at her local pharmacy or Health Dept. Verbalized acceptance and understanding.  Screening Tests Health Maintenance  Topic Date Due  . INFLUENZA VACCINE  07/17/2018  . TETANUS/TDAP  08/23/2021  . PNA vac Low Risk Adult  Completed   Cancer Screenings: Lung: Low Dose CT Chest recommended if Age 82-80 years, 30 pack-year currently smoking OR have quit w/in 15years. Patient does not qualify. Colorectal: Up to date  Additional Screenings:  Hepatitis C Screening: N/A      Plan:  I have personally reviewed and addressed the Medicare Annual Wellness questionnaire and have noted the following in the patient's chart:  A. Medical and social history B. Use of alcohol, tobacco or illicit drugs  C. Current medications and supplements D. Functional ability and status E.    Nutritional status F.  Physical activity G. Advance directives H. List of other physicians I.  Hospitalizations, surgeries, and ER visits in previous 12 months J.  [place] such as hearing and vision if needed, cognitive and depression L. Referrals and appointments - none  In addition, I have reviewed and discussed with patient certain preventive protocols, quality metrics, and best practice recommendations. A written personalized care plan for preventive services as well as general preventive health recommendations were provided to patient.  See attached scanned questionnaire for additional information.   Signed,  Fabio Neighbors, LPN Nurse Health Advisor   Nurse Recommendations: Pts BP was elevated both times they were taken during wellness visit. Notified PCP - see TE note.

## 2018-08-06 NOTE — Telephone Encounter (Signed)
Pt was seen for his AWV today and his BP was elevated both times it was taken. First time was 182/92 and second time was 184/80. Pt declined having any s/s. PCP notified at the end of visit and was told to start pt on Amlodipine daily and recheck in 7-10 days. Advised pt of this and he declined starting a new medication until he spoke with the provider first. Offered pt the soonest apt and pt declinedt. Pt agreed to an apt on 08/08/18 @ 1:40 PM. Advised pt to check BP twice a day, record readings and bring results to 08/08/18 apt. Also advised pt to contact office with any concerns and go to the ER with any chest pain/pressure, SOB, lightheadedness or nausea. Pt stated understanding. FYI to PCP. Please let me know if the apt needs to be changed or with any further instructions if needed. -MM

## 2018-08-06 NOTE — Patient Instructions (Addendum)
Travis Austin , Thank you for taking time to come for your Medicare Wellness Visit. I appreciate your ongoing commitment to your health goals. Please review the following plan we discussed and let me know if I can assist you in the future.   Screening recommendations/referrals: Colonoscopy: Up to date Recommended yearly ophthalmology/optometry visit for glaucoma screening and checkup Recommended yearly dental visit for hygiene and checkup  Vaccinations: Influenza vaccine: Up to date Pneumococcal vaccine: Up to date Tdap vaccine: Up to date Shingles vaccine: Pt declines today.     Advanced directives: Advance directive discussed with you today. Even though you declined this today please call our office should you change your mind and we can give you the proper paperwork for you to fill out.  Conditions/risks identified: Fall risk prevention  Next appointment: 12/26/2018 with Dr Caryn Section.   Preventive Care 14 Years and Older, Male Preventive care refers to lifestyle choices and visits with your health care provider that can promote health and wellness. What does preventive care include?  A yearly physical exam. This is also called an annual well check.  Dental exams once or twice a year.  Routine eye exams. Ask your health care provider how often you should have your eyes checked.  Personal lifestyle choices, including:  Daily care of your teeth and gums.  Regular physical activity.  Eating a healthy diet.  Avoiding tobacco and drug use.  Limiting alcohol use.  Practicing safe sex.  Taking low doses of aspirin every day.  Taking vitamin and mineral supplements as recommended by your health care provider. What happens during an annual well check? The services and screenings done by your health care provider during your annual well check will depend on your age, overall health, lifestyle risk factors, and family history of disease. Counseling  Your health care provider may ask  you questions about your:  Alcohol use.  Tobacco use.  Drug use.  Emotional well-being.  Home and relationship well-being.  Sexual activity.  Eating habits.  History of falls.  Memory and ability to understand (cognition).  Work and work Statistician. Screening  You may have the following tests or measurements:  Height, weight, and BMI.  Blood pressure.  Lipid and cholesterol levels. These may be checked every 5 years, or more frequently if you are over 48 years old.  Skin check.  Lung cancer screening. You may have this screening every year starting at age 70 if you have a 30-pack-year history of smoking and currently smoke or have quit within the past 15 years.  Fecal occult blood test (FOBT) of the stool. You may have this test every year starting at age 29.  Flexible sigmoidoscopy or colonoscopy. You may have a sigmoidoscopy every 5 years or a colonoscopy every 10 years starting at age 59.  Prostate cancer screening. Recommendations will vary depending on your family history and other risks.  Hepatitis C blood test.  Hepatitis B blood test.  Sexually transmitted disease (STD) testing.  Diabetes screening. This is done by checking your blood sugar (glucose) after you have not eaten for a while (fasting). You may have this done every 1-3 years.  Abdominal aortic aneurysm (AAA) screening. You may need this if you are a current or former smoker.  Osteoporosis. You may be screened starting at age 50 if you are at high risk. Talk with your health care provider about your test results, treatment options, and if necessary, the need for more tests. Vaccines  Your health care provider  may recommend certain vaccines, such as:  Influenza vaccine. This is recommended every year.  Tetanus, diphtheria, and acellular pertussis (Tdap, Td) vaccine. You may need a Td booster every 10 years.  Zoster vaccine. You may need this after age 79.  Pneumococcal 13-valent conjugate  (PCV13) vaccine. One dose is recommended after age 24.  Pneumococcal polysaccharide (PPSV23) vaccine. One dose is recommended after age 10. Talk to your health care provider about which screenings and vaccines you need and how often you need them. This information is not intended to replace advice given to you by your health care provider. Make sure you discuss any questions you have with your health care provider. Document Released: 12/30/2015 Document Revised: 08/22/2016 Document Reviewed: 10/04/2015 Elsevier Interactive Patient Education  2017 [employer] Prevention in the Home Falls can cause injuries. They can happen to people of all ages. There are many things you can do to make your home safe and to help prevent falls. What can I do on the outside of my home?  Regularly fix the edges of walkways and driveways and fix any cracks.  Remove anything that might make you trip as you walk through a door, such as a raised step or threshold.  Trim any bushes or trees on the path to your home.  Use bright outdoor lighting.  Clear any walking paths of anything that might make someone trip, such as rocks or tools.  Regularly check to see if handrails are loose or broken. Make sure that both sides of any steps have handrails.  Any raised decks and porches should have guardrails on the edges.  Have any leaves, snow, or ice cleared regularly.  Use sand or salt on walking paths during winter.  Clean up any spills in your garage right away. This includes oil or grease spills. What can I do in the bathroom?  Use night lights.  Install grab bars by the toilet and in the tub and shower. Do not use towel bars as grab bars.  Use non-skid mats or decals in the tub or shower.  If you need to sit down in the shower, use a plastic, non-slip stool.  Keep the floor dry. Clean up any water that spills on the floor as soon as it happens.  Remove soap buildup in the tub or shower  regularly.  Attach bath mats securely with double-sided non-slip rug tape.  Do not have throw rugs and other things on the floor that can make you trip. What can I do in the bedroom?  Use night lights.  Make sure that you have a light by your bed that is easy to reach.  Do not use any sheets or blankets that are too big for your bed. They should not hang down onto the floor.  Have a firm chair that has side arms. You can use this for support while you get dressed.  Do not have throw rugs and other things on the floor that can make you trip. What can I do in the kitchen?  Clean up any spills right away.  Avoid walking on wet floors.  Keep items that you use a lot in easy-to-reach places.  If you need to reach something above you, use a strong step stool that has a grab bar.  Keep electrical cords out of the way.  Do not use floor polish or wax that makes floors slippery. If you must use wax, use non-skid floor wax.  Do not have throw rugs  and other things on the floor that can make you trip. What can I do with my stairs?  Do not leave any items on the stairs.  Make sure that there are handrails on both sides of the stairs and use them. Fix handrails that are broken or loose. Make sure that handrails are as long as the stairways.  Check any carpeting to make sure that it is firmly attached to the stairs. Fix any carpet that is loose or worn.  Avoid having throw rugs at the top or bottom of the stairs. If you do have throw rugs, attach them to the floor with carpet tape.  Make sure that you have a light switch at the top of the stairs and the bottom of the stairs. If you do not have them, ask someone to add them for you. What else can I do to help prevent falls?  Wear shoes that:  Do not have high heels.  Have rubber bottoms.  Are comfortable and fit you well.  Are closed at the toe. Do not wear sandals.  If you use a stepladder:  Make sure that it is fully  opened. Do not climb a closed stepladder.  Make sure that both sides of the stepladder are locked into place.  Ask someone to hold it for you, if possible.  Clearly mark and make sure that you can see:  Any grab bars or handrails.  First and last steps.  Where the edge of each step is.  Use tools that help you move around (mobility aids) if they are needed. These include:  Canes.  Walkers.  Scooters.  Crutches.  Turn on the lights when you go into a dark area. Replace any light bulbs as soon as they burn out.  Set up your furniture so you have a clear path. Avoid moving your furniture around.  If any of your floors are uneven, fix them.  If there are any pets around you, be aware of where they are.  Review your medicines with your doctor. Some medicines can make you feel dizzy. This can increase your chance of falling. Ask your doctor what other things that you can do to help prevent falls. This information is not intended to replace advice given to you by your health care provider. Make sure you discuss any questions you have with your health care provider. Document Released: 09/29/2009 Document Revised: 05/10/2016 Document Reviewed: 01/07/2015 Elsevier Interactive Patient Education  2017 [employer].

## 2018-08-08 ENCOUNTER — Ambulatory Visit: Payer: Medicare Other | Admitting: Family Medicine

## 2018-09-08 DIAGNOSIS — D692 Other nonthrombocytopenic purpura: Secondary | ICD-10-CM | POA: Diagnosis not present

## 2018-09-08 DIAGNOSIS — D225 Melanocytic nevi of trunk: Secondary | ICD-10-CM | POA: Diagnosis not present

## 2018-09-08 DIAGNOSIS — L719 Rosacea, unspecified: Secondary | ICD-10-CM | POA: Diagnosis not present

## 2018-09-08 DIAGNOSIS — L821 Other seborrheic keratosis: Secondary | ICD-10-CM | POA: Diagnosis not present

## 2018-09-08 DIAGNOSIS — L2089 Other atopic dermatitis: Secondary | ICD-10-CM | POA: Diagnosis not present

## 2018-09-08 DIAGNOSIS — L812 Freckles: Secondary | ICD-10-CM | POA: Diagnosis not present

## 2018-09-08 DIAGNOSIS — Z1283 Encounter for screening for malignant neoplasm of skin: Secondary | ICD-10-CM | POA: Diagnosis not present

## 2018-09-08 DIAGNOSIS — D229 Melanocytic nevi, unspecified: Secondary | ICD-10-CM | POA: Diagnosis not present

## 2018-09-08 DIAGNOSIS — L578 Other skin changes due to chronic exposure to nonionizing radiation: Secondary | ICD-10-CM | POA: Diagnosis not present

## 2018-09-08 DIAGNOSIS — Z85828 Personal history of other malignant neoplasm of skin: Secondary | ICD-10-CM | POA: Diagnosis not present

## 2018-09-08 DIAGNOSIS — L570 Actinic keratosis: Secondary | ICD-10-CM | POA: Diagnosis not present

## 2018-09-08 DIAGNOSIS — D2230 Melanocytic nevi of unspecified part of face: Secondary | ICD-10-CM | POA: Diagnosis not present

## 2018-09-10 ENCOUNTER — Other Ambulatory Visit: Payer: Self-pay | Admitting: Family Medicine

## 2018-09-11 DIAGNOSIS — Z23 Encounter for immunization: Secondary | ICD-10-CM | POA: Diagnosis not present

## 2018-10-14 ENCOUNTER — Encounter: Payer: Self-pay | Admitting: Family Medicine

## 2018-10-14 ENCOUNTER — Ambulatory Visit (INDEPENDENT_AMBULATORY_CARE_PROVIDER_SITE_OTHER): Payer: Medicare Other | Admitting: Family Medicine

## 2018-10-14 VITALS — BP 200/100 | HR 71 | Temp 97.6°F | Resp 16 | Wt 163.0 lb

## 2018-10-14 DIAGNOSIS — R6882 Decreased libido: Secondary | ICD-10-CM | POA: Diagnosis not present

## 2018-10-14 DIAGNOSIS — I10 Essential (primary) hypertension: Secondary | ICD-10-CM

## 2018-10-14 DIAGNOSIS — E039 Hypothyroidism, unspecified: Secondary | ICD-10-CM | POA: Diagnosis not present

## 2018-10-14 NOTE — Progress Notes (Signed)
       Patient: Travis Austin Male    DOB: 10-07-1931   84 y.o.   MRN: [id number] Visit Date: 10/14/2018  Today's Provider: Lelon Huh, MD   Chief Complaint  Patient presents with  . Erectile Dysfunction   Subjective:    HPI ED: Patient comes in today wanting to discuss personal issues. His wife diet over 10 year ago and has not been in relationship until recently. Now he finds that he has very little interest in sex and difficulty with function. He would like something to improve his libido and help function more normally. Otherwise he feels well, no chest pain, weakness, dyspnea or changes in mood. He states the woman he is seeing travels a lot and doesn't expect to see her again for several weeks.     Allergies  Allergen Reactions  . Alpha-Gal     (meat allergy from tick bite)     Current Outpatient Medications:  .  aspirin EC 81 MG tablet, Take 81 mg by mouth daily. , Disp: , Rfl:  .  lisinopril (PRINIVIL,ZESTRIL) 20 MG tablet, TAKE 1 TABLET DAILY, Disp: 90 tablet, Rfl: 4 .  Multiple Vitamin (MULTIVITAMIN WITH MINERALS) TABS tablet, Take 1 tablet daily by mouth., Disp: , Rfl:  .  SYNTHROID 25 MCG tablet, TAKE 1 TABLET DAILY BEFORE BREAKFAST, Disp: 90 tablet, Rfl: 4 .  verapamil (VERELAN PM) 120 MG 24 hr capsule, TAKE 1 CAPSULE DAILY., Disp: 90 capsule, Rfl: 3  Review of Systems  Constitutional: Negative for appetite change, chills and fever.  Respiratory: Negative for chest tightness, shortness of breath and wheezing.   Cardiovascular: Negative for chest pain and palpitations.  Gastrointestinal: Negative for abdominal pain, nausea and vomiting.    Social History   Tobacco Use  . Smoking status: Never Smoker  . Smokeless tobacco: Never Used  Substance Use Topics  . Alcohol use: Yes    Alcohol/week: 0.0 standard drinks    Comment: 1 drink a day, any kind   Objective:   BP (!) 200/100 (BP Location: Right Arm, Cuff Size: Normal)   Pulse 71   Temp 97.6 F (36.4  C) (Oral)   Resp 16   Wt 163 lb (73.9 kg)   SpO2 98% Comment: room air  BMI 24.78 kg/m  Vitals:   10/14/18 1328 10/14/18 1332  BP: (!) 218/98 (!) 200/100  Pulse: 71   Resp: 16   Temp: 97.6 F (36.4 C)   TempSrc: Oral   SpO2: 98%   Weight: 163 lb (73.9 kg)      Physical Exam   General appearance: alert, well developed, well nourished, cooperative and in no distress Head: Normocephalic, without obvious abnormality, atraumatic Respiratory: Respirations even and unlabored, normal respiratory rate Extremities: No gross deformities Skin: Skin color, texture, turgor normal. No rashes seen  Psych: Appropriate mood and affect. Neurologic: Mental status: Alert, oriented to person, place, and time, thought content appropriate.       Assessment & Plan:     1. Libido, decreased Consider daily ED medication if normal thyroid and testosterone.  - Testosterone,Free and Total  2. Hypothyroidism, unspecified type  - TSH  3. Essential hypertension BP up significantly today, likely exacerbated by topic of visit. Was previously well controlled on hctz and spironolactone. Consider adding low dose of amlodipine.        Lelon Huh, MD  [facility]

## 2018-10-15 ENCOUNTER — Telehealth: Payer: Self-pay

## 2018-10-15 ENCOUNTER — Other Ambulatory Visit: Payer: Self-pay | Admitting: Family Medicine

## 2018-10-15 DIAGNOSIS — I10 Essential (primary) hypertension: Secondary | ICD-10-CM

## 2018-10-15 MED ORDER — AMLODIPINE BESYLATE 5 MG PO TABS: 5.0000 mg | ORAL_TABLET | Freq: Every day | ORAL | 0 refills | Status: DC

## 2018-10-15 NOTE — Telephone Encounter (Signed)
amLODipine (NORVASC) 5 MG tablet  verapamil (VERELAN PM) 120 MG 24 hr capsule   Pt wanting to know if he is supposed to take both medications together. Not sure if it will be safe.  Please advise.  Thanks, [employer]

## 2018-10-15 NOTE — Telephone Encounter (Signed)
-----   Message from Birdie Sons, MD sent at 10/15/2018 11:25 AM EDT ----- Correction, instead of hctz, start amlodipine 5mg  QD #30, rf x 0 and follow up in 3 weeks for bp check. [facility] [place].

## 2018-10-15 NOTE — Telephone Encounter (Signed)
He is already on lowest dose of verapamil there. Instead of amlodipine can add indapamide 1.25mg  once a day, #30 no refills. . Can call pharmacy and cancel amlodipine prescription.

## 2018-10-15 NOTE — Telephone Encounter (Signed)
See telephone message

## 2018-10-15 NOTE — Telephone Encounter (Signed)
Patient was advised of lab report he states that he will not take Amlodipine because he states when he was on it before he had severe headaches and low blood pressure readings. Patient states that when he checks his blood pressure readings there all within normal range. Patient states that if you want him to start Amlodipine than he request that you prescribe a lower dose of Verapamil or to take him off Verapamil ? Please advise. KW

## 2018-10-16 LAB — TESTOSTERONE,FREE AND TOTAL
TESTOSTERONE FREE: 4.8 pg/mL — AB (ref 6.6–18.1)
TESTOSTERONE: 447 ng/dL (ref 264–916)

## 2018-10-16 LAB — TSH: TSH: 2.64 u[IU]/mL (ref 0.450–4.500)

## 2018-10-16 MED ORDER — INDAPAMIDE 1.25 MG PO TABS: 1.2500 mg | ORAL_TABLET | Freq: Every day | ORAL | 0 refills | Status: DC

## 2018-10-16 NOTE — Telephone Encounter (Signed)
Patient advised and agrees to try Indapamide 1.25mg  once a day. Prescription sent into pharmacy. I also cancelled prescription for Amlodipine that was previously sent into the pharmacy. Follow up appointment scheduled 11/04/2018 at 11am. Patient wanted me to let Dr. Caryn Section know that his home blood pressure readings are much lower that what we get in the office. He says last night his blood pressure was 120/66 and then 116/63. Tuesday night his blood pressure was 115/61 and then 102/55. Patient wants to be sure Dr. Caryn Section knows about these low readings and whether they are too low. I advised patient to continue monitoring his blood pressure at home and to bring his monitor with him to his next follow up appointment.

## 2018-10-28 ENCOUNTER — Telehealth: Payer: Self-pay | Admitting: Family Medicine

## 2018-10-28 NOTE — Telephone Encounter (Signed)
Pt returned missed call. Please call pt back if needed.  Thanks, [employer]

## 2018-10-28 NOTE — Telephone Encounter (Signed)
Did not call pt.   Thanks,   -Mickel Baas

## 2018-11-04 ENCOUNTER — Ambulatory Visit: Payer: Self-pay | Admitting: Family Medicine

## 2018-11-07 ENCOUNTER — Ambulatory Visit (INDEPENDENT_AMBULATORY_CARE_PROVIDER_SITE_OTHER): Payer: Medicare Other | Admitting: Family Medicine

## 2018-11-07 ENCOUNTER — Encounter: Payer: Self-pay | Admitting: Family Medicine

## 2018-11-07 VITALS — BP 182/72 | HR 68 | Temp 97.4°F | Resp 16 | Wt 164.0 lb

## 2018-11-07 DIAGNOSIS — I10 Essential (primary) hypertension: Secondary | ICD-10-CM

## 2018-11-07 NOTE — Progress Notes (Signed)
       Patient: Travis Austin Male    DOB: May 27, 1931   84 y.o.   MRN: [id number] Visit Date: 11/07/2018  Today's Provider: Lelon Huh, MD   Chief Complaint  Patient presents with  . Hypertension   Subjective:    Patent is here to compare his home BP machine with office BP monitor.  Review of readings stored in memory of his machine shows SBP BP range 100s-120s/SBP 60's-70s over the last months. He was started on indapamide after visit of 10/14/2018 with readings below.  BP Readings from Last 3 Encounters:  11/07/18 (!) 182/72  10/14/18 (!) 200/100  08/06/18 (!) 184/80     Allergies  Allergen Reactions  . Alpha-Gal     (meat allergy from tick bite)     Current Outpatient Medications:  .  aspirin EC 81 MG tablet, Take 81 mg by mouth daily. , Disp: , Rfl:  .  indapamide (LOZOL) 1.25 MG tablet, Take 1 tablet (1.25 mg total) by mouth daily., Disp: 30 tablet, Rfl: 0 .  lisinopril (PRINIVIL,ZESTRIL) 20 MG tablet, TAKE 1 TABLET DAILY, Disp: 90 tablet, Rfl: 4 .  Multiple Vitamin (MULTIVITAMIN WITH MINERALS) TABS tablet, Take 1 tablet daily by mouth., Disp: , Rfl:  .  SYNTHROID 25 MCG tablet, TAKE 1 TABLET DAILY BEFORE BREAKFAST, Disp: 90 tablet, Rfl: 4 .  verapamil (VERELAN PM) 120 MG 24 hr capsule, TAKE 1 CAPSULE DAILY., Disp: 90 capsule, Rfl: 3  Review of Systems  Constitutional: Negative.  Negative for malaise/fatigue.  Eyes: Negative for blurred vision.  Respiratory: Negative.  Negative for shortness of breath.   Cardiovascular: Negative.  Negative for chest pain, palpitations, orthopnea and PND.  Gastrointestinal: Negative.   Musculoskeletal: Negative for neck pain.  Neurological: Negative for dizziness and headaches.    Social History   Tobacco Use  . Smoking status: Never Smoker  . Smokeless tobacco: Never Used  Substance Use Topics  . Alcohol use: Yes    Alcohol/week: 0.0 standard drinks    Comment: 1 drink a day, any kind   Objective:   BP (!) 182/72  (BP Location: Left Arm, Patient Position: Sitting, Cuff Size: Normal)   Pulse 68   Temp (!) 97.4 F (36.3 C) (Oral)   Resp 16   Wt 164 lb (74.4 kg)   BMI 24.94 kg/m  Vitals:   11/07/18 1637  BP: (!) 182/72  Pulse: 68  Resp: 16  Temp: (!) 97.4 F (36.3 C)  TempSrc: Oral  Weight: 164 lb (74.4 kg)   Patient's monitor reads 192/86 on left arm and 200/93 on right arm compared to reading with BFP manual cuff above.      Assessment & Plan:     1. White coat syndrome with hypertension Confirmed normal BP readings in memory of patients electronic automatic BP monitor, with BP readings in the office being very elevated, consistent with our manual monitor. He can continue his current medications and recording home BP with this monitor and bring record to every office visit.        Lelon Huh, MD  [facility]

## 2018-11-10 ENCOUNTER — Other Ambulatory Visit: Payer: Self-pay | Admitting: Family Medicine

## 2018-11-10 DIAGNOSIS — I10 Essential (primary) hypertension: Secondary | ICD-10-CM

## 2018-11-20 DIAGNOSIS — H40003 Preglaucoma, unspecified, bilateral: Secondary | ICD-10-CM | POA: Diagnosis not present

## 2018-12-06 ENCOUNTER — Other Ambulatory Visit: Payer: Self-pay | Admitting: Family Medicine

## 2018-12-06 DIAGNOSIS — I10 Essential (primary) hypertension: Secondary | ICD-10-CM

## 2018-12-26 ENCOUNTER — Ambulatory Visit (INDEPENDENT_AMBULATORY_CARE_PROVIDER_SITE_OTHER): Payer: Medicare Other | Admitting: Family Medicine

## 2018-12-26 ENCOUNTER — Encounter: Payer: Self-pay | Admitting: Family Medicine

## 2018-12-26 ENCOUNTER — Telehealth: Payer: Self-pay | Admitting: Family Medicine

## 2018-12-26 ENCOUNTER — Ambulatory Visit: Payer: Medicare Other | Admitting: Family Medicine

## 2018-12-26 VITALS — BP 164/88 | HR 64 | Temp 97.7°F | Resp 16 | Ht 68.0 in | Wt 164.0 lb

## 2018-12-26 DIAGNOSIS — Z125 Encounter for screening for malignant neoplasm of prostate: Secondary | ICD-10-CM

## 2018-12-26 DIAGNOSIS — Z23 Encounter for immunization: Secondary | ICD-10-CM

## 2018-12-26 DIAGNOSIS — I10 Essential (primary) hypertension: Secondary | ICD-10-CM | POA: Diagnosis not present

## 2018-12-26 DIAGNOSIS — E039 Hypothyroidism, unspecified: Secondary | ICD-10-CM

## 2018-12-26 DIAGNOSIS — R6882 Decreased libido: Secondary | ICD-10-CM | POA: Diagnosis not present

## 2018-12-26 MED ORDER — INDAPAMIDE 1.25 MG PO TABS: 1.2500 mg | ORAL_TABLET | Freq: Every day | ORAL | 4 refills | Status: DC

## 2018-12-26 NOTE — Patient Instructions (Addendum)
.   Please bring all of your medications to every appointment so we can make sure that our medication list is the same as yours.    The CDC recommends two doses of Shingrix (the shingles vaccine) separated by 2 to 6 months for adults age 83 years and older. I recommend checking with your insurance plan regarding coverage for this vaccine.    

## 2018-12-26 NOTE — Progress Notes (Signed)
Patient: Travis Austin Male    DOB: [birth year]   85 y.o.   MRN: [id number] Visit Date: 12/26/2018  Today's Provider: Lelon Huh, MD   Chief Complaint  Patient presents with  . Hypertension  . Hypothyroidism   Subjective:     Thyroid Problem  Presents for follow-up visit. Patient reports no anxiety, cold intolerance, constipation, depressed mood, diaphoresis, diarrhea, dry skin, fatigue, hair loss, heat intolerance, hoarse voice, leg swelling, nail problem, palpitations, tremors, visual change, weight gain or weight loss. The symptoms have been stable.       Hypertension, follow-up:  BP Readings from Last 3 Encounters:  12/26/18 (!) 164/88  11/07/18 (!) 182/72  10/14/18 (!) 200/100    He was last seen for hypertension 6 weeks ago.  BP at that visit was 182/72. Management since that visit includes No changes, pt advised to bring home BP readings. Home BP readings show systolics mostly in 956O and 130s.  He reports excellent compliance with treatment. He is not having side effects.  He is exercising. He is not adherent to low salt diet.   Outside blood pressures are 110's-130's/60-70's per record he is brings to office today.  He is experiencing Cough. Pt states this has improved but is concerned of a potential side effect with lisinopril. Patient denies chest pain, dyspnea, exertional chest pressure/discomfort, fatigue, lower extremity edema, palpitations and paroxysmal nocturnal dyspnea.   Cardiovascular risk factors include advanced age (older than 84 for men, 74 for women) and male gender.  Use of agents associated with hypertension: none.     Weight trend: stable Wt Readings from Last 3 Encounters:  12/26/18 164 lb (74.4 kg)  11/07/18 164 lb (74.4 kg)  10/14/18 163 lb (73.9 kg)    Current diet: in general, an "unhealthy" diet  ------------------------------------------------------------------------    Allergies  Allergen Reactions  . Alpha-Gal       (meat allergy from tick bite)     Current Outpatient Medications:  .  aspirin EC 81 MG tablet, Take 81 mg by mouth daily. , Disp: , Rfl:  .  indapamide (LOZOL) 1.25 MG tablet, TAKE 1 TABLET (1.25 MG TOTAL) BY MOUTH DAILY., Disp: 90 tablet, Rfl: 4 .  lisinopril (PRINIVIL,ZESTRIL) 20 MG tablet, TAKE 1 TABLET DAILY, Disp: 90 tablet, Rfl: 4 .  Multiple Vitamin (MULTIVITAMIN WITH MINERALS) TABS tablet, Take 1 tablet daily by mouth., Disp: , Rfl:  .  SYNTHROID 25 MCG tablet, TAKE 1 TABLET DAILY BEFORE BREAKFAST, Disp: 90 tablet, Rfl: 4 .  verapamil (VERELAN PM) 120 MG 24 hr capsule, TAKE 1 CAPSULE DAILY., Disp: 90 capsule, Rfl: 3  Review of Systems  Constitutional: Negative for diaphoresis, fatigue, weight gain and weight loss.  HENT: Positive for hearing loss. Negative for hoarse voice.   Eyes: Negative.   Respiratory: Positive for cough. Negative for apnea, choking, chest tightness, shortness of breath, wheezing and stridor.   Cardiovascular: Negative.  Negative for palpitations.  Gastrointestinal: Negative.  Negative for constipation and diarrhea.  Endocrine: Negative for cold intolerance and heat intolerance.  Genitourinary: Negative.   Musculoskeletal: Positive for gait problem. Negative for arthralgias, back pain, joint swelling, myalgias, neck pain and neck stiffness.  Skin: Negative.   Allergic/Immunologic: Positive for food allergies.  Neurological: Positive for weakness. Negative for dizziness, tremors, seizures, syncope, facial asymmetry, speech difficulty, light-headedness, numbness and headaches.  Hematological: Negative.   Psychiatric/Behavioral: The patient is not nervous/anxious.     Social History   Tobacco Use  .  Smoking status: Never Smoker  . Smokeless tobacco: Never Used  Substance Use Topics  . Alcohol use: Yes    Alcohol/week: 0.0 standard drinks    Comment: 1 drink a day, any kind      Objective:   BP (!) 164/88 (BP Location: Right Arm, Patient  Position: Sitting, Cuff Size: Normal)   Pulse 64   Temp 97.7 F (36.5 C) (Oral)   Resp 16   Ht 5\' 8"  (1.727 m)   Wt 164 lb (74.4 kg)   BMI 24.94 kg/m  Vitals:   12/26/18 1409  BP: (!) 164/88  Pulse: 64  Resp: 16  Temp: 97.7 F (36.5 C)  TempSrc: Oral  Weight: 164 lb (74.4 kg)  Height: 5\' 8"  (1.727 m)     Physical Exam   General Appearance:    Alert, cooperative, no distress  Eyes:    PERRL, conjunctiva/corneas clear, EOM's intact       Lungs:     Clear to auscultation bilaterally, respirations unlabored  Heart:    Regular rate and rhythm  Neurologic:   Awake, alert, oriented x 3. No apparent focal neurological           defect.           Assessment & Plan    1. Essential hypertension Improved with addition of lozol, and home BP readings are much better than office readings. He did bring his home BP cuff to his visit today and BP on his machine was consistently with ours. Continue current medications.   - Comprehensive metabolic panel - Lipid panel - CBC - EKG 12-Lead  2. Libido, decreased He had low free testosterone on labs done in November. He is interested in testosterone replacement. Will check labs today to confirm hypoaldosteronism.  - Testosterone,Free and Total - PSA  3. Hypothyroidism, unspecified type check- TSH  4. Prostate cancer screening  - PSA  5. Need for shingles vaccine He wants vaccine and is willing to pay out of pocket if not covered by insurance.  - Varicella-zoster vaccine IM     Lelon Huh, MD  [facility]

## 2018-12-26 NOTE — Telephone Encounter (Signed)
Patient would like refills of Indapamide 1.25 mg. Sent to mail order [facility] please.

## 2018-12-27 LAB — COMPREHENSIVE METABOLIC PANEL
ALBUMIN: 4.1 g/dL (ref 3.5–4.7)
ALT: 23 IU/L (ref 0–44)
AST: 21 IU/L (ref 0–40)
Albumin/Globulin Ratio: 1.6 (ref 1.2–2.2)
Alkaline Phosphatase: 97 IU/L (ref 39–117)
BUN / CREAT RATIO: 28 — AB (ref 10–24)
BUN: 26 mg/dL (ref 8–27)
Bilirubin Total: 0.5 mg/dL (ref 0.0–1.2)
CHLORIDE: 91 mmol/L — AB (ref 96–106)
CO2: 24 mmol/L (ref 20–29)
CREATININE: 0.92 mg/dL (ref 0.76–1.27)
Calcium: 9.2 mg/dL (ref 8.6–10.2)
GFR calc non Af Amer: 76 mL/min/{1.73_m2} (ref >59)
GFR, EST AFRICAN AMERICAN: 87 mL/min/{1.73_m2} (ref >59)
Globulin, Total: 2.6 g/dL (ref 1.5–4.5)
Glucose: 89 mg/dL (ref 65–99)
Potassium: 4.2 mmol/L (ref 3.5–5.2)
Sodium: 131 mmol/L — ABNORMAL LOW (ref 134–144)
TOTAL PROTEIN: 6.7 g/dL (ref 6.0–8.5)

## 2018-12-27 LAB — CBC
Hematocrit: 46.2 % (ref 37.5–51.0)
Hemoglobin: 16.2 g/dL (ref 13.0–17.7)
MCH: 32.4 pg (ref 26.6–33.0)
MCHC: 35.1 g/dL (ref 31.5–35.7)
MCV: 92 fL (ref 79–97)
PLATELETS: 325 10*3/uL (ref 150–450)
RBC: 5 x10E6/uL (ref 4.14–5.80)
RDW: 11.9 % (ref 11.6–15.4)
WBC: 6.5 10*3/uL (ref 3.4–10.8)

## 2018-12-27 LAB — LIPID PANEL
CHOLESTEROL TOTAL: 139 mg/dL (ref 100–199)
Chol/HDL Ratio: 3 ratio (ref 0.0–5.0)
HDL: 47 mg/dL (ref >39)
LDL CALC: 75 mg/dL (ref 0–99)
Triglycerides: 84 mg/dL (ref 0–149)
VLDL CHOLESTEROL CAL: 17 mg/dL (ref 5–40)

## 2018-12-27 LAB — TESTOSTERONE,FREE AND TOTAL
Testosterone, Free: 4.3 pg/mL — ABNORMAL LOW (ref 6.6–18.1)
Testosterone: 364 ng/dL (ref 264–916)

## 2018-12-27 LAB — TSH: TSH: 1.67 u[IU]/mL (ref 0.450–4.500)

## 2018-12-27 LAB — PSA: PROSTATE SPECIFIC AG, SERUM: 0.4 ng/mL (ref 0.0–4.0)

## 2018-12-30 ENCOUNTER — Telehealth: Payer: Self-pay

## 2018-12-30 NOTE — Telephone Encounter (Signed)
-----   Message from Birdie Sons, MD sent at 12/28/2018  5:38 PM EST ----- Sodium level is stable at 131. Continue current BP medications.  Testosterone level is borderline low. About the same as when checked on October.  If he wants to try testosterone gel we can send in prescription. If so he will need to follow up in a month recheck levels at follow up.

## 2018-12-30 NOTE — Telephone Encounter (Signed)
Pt advised.  He would like to come in and discuss the results with Dr. Caryn Section.  Made apt for 01/13/2019.    Thanks,   -Mickel Baas

## 2019-01-13 ENCOUNTER — Ambulatory Visit (INDEPENDENT_AMBULATORY_CARE_PROVIDER_SITE_OTHER): Payer: Medicare Other | Admitting: Family Medicine

## 2019-01-13 VITALS — BP 184/81 | HR 68 | Wt 162.0 lb

## 2019-01-13 DIAGNOSIS — R7989 Other specified abnormal findings of blood chemistry: Secondary | ICD-10-CM

## 2019-01-13 MED ORDER — TESTOSTERONE 2 MG/24HR TD PT24: 1.0000 | MEDICATED_PATCH | Freq: Every day | TRANSDERMAL | 2 refills | Status: DC

## 2019-01-13 NOTE — Progress Notes (Signed)
Patient: Travis Austin Male    DOB: May 09, 1931   85 y.o.   MRN: [id number] Visit Date: 01/13/2019  Today's Provider: Lelon Huh, MD   No chief complaint on file.  Subjective:     HPI  Patient presents to discuss testosterone replacement. He has had two free T4 done in the last month which were below LLN. He had entered a relationship and is concerned about libido and ED and is a little fatigued.   He has also started liberalizing his salt intake since recent sodium had dropped to 131 on Lozol. He reports home blood pressures are usually much better than office Bps.   Office Visit on 12/26/2018  Component Date Value Ref Range Status  . TSH 12/26/2018 1.670  0.450 - 4.500 uIU/mL Final  . Testosterone 12/26/2018 364  264 - 916 ng/dL Final   Comment: Adult male reference interval is based on a population of healthy nonobese males (BMI <30) between 58 and 42 years old. [place], [place] [phone]. PMID: [id number].   Marland Kitchen Testosterone, Free 12/26/2018 4.3* 6.6 - 18.1 pg/mL Final  . Prostate Specific Ag, Serum 12/26/2018 0.4  0.0 - 4.0 ng/mL Final  Office Visit on 10/14/2018  Component Date Value Ref Range Status  . Testosterone 10/14/2018 447  264 - 916 ng/dL Final   Comment: Adult male reference interval is based on a population of healthy nonobese males (BMI <30) between 45 and 44 years old. [place], [place] [phone]. PMID: 67591638.   Marland Kitchen Testosterone, Free 10/14/2018 4.8* 6.6 - 18.1 pg/mL Final  . TSH 10/14/2018 2.640  0.450 - 4.500 uIU/mL Final     Allergies  Allergen Reactions  . Alpha-Gal     (meat allergy from tick bite)     Current Outpatient Medications:  .  aspirin EC 81 MG tablet, Take 81 mg by mouth daily. , Disp: , Rfl:  .  indapamide (LOZOL) 1.25 MG tablet, Take 1 tablet (1.25 mg total) by mouth daily., Disp: 90 tablet, Rfl: 4 .  lisinopril (PRINIVIL,ZESTRIL) 20 MG tablet, TAKE 1 TABLET DAILY, Disp: 90 tablet, Rfl: 4 .   Multiple Vitamin (MULTIVITAMIN WITH MINERALS) TABS tablet, Take 1 tablet daily by mouth., Disp: , Rfl:  .  SYNTHROID 25 MCG tablet, TAKE 1 TABLET DAILY BEFORE BREAKFAST, Disp: 90 tablet, Rfl: 4 .  verapamil (VERELAN PM) 120 MG 24 hr capsule, TAKE 1 CAPSULE DAILY., Disp: 90 capsule, Rfl: 3  Review of Systems  Constitutional: Negative for appetite change, chills and fever.  Respiratory: Negative for chest tightness, shortness of breath and wheezing.   Cardiovascular: Negative for chest pain and palpitations.  Gastrointestinal: Negative for abdominal pain, nausea and vomiting.    Social History   Tobacco Use  . Smoking status: Never Smoker  . Smokeless tobacco: Never Used  Substance Use Topics  . Alcohol use: Yes    Alcohol/week: 0.0 standard drinks    Comment: 1 drink a day, any kind      Objective:   BP (!) 184/81   Pulse 68   Wt 162 lb (73.5 kg)   BMI 24.63 kg/m    Physical Exam  General appearance: alert, well developed, well nourished, cooperative and in no distress Head: Normocephalic, without obvious abnormality, atraumatic Respiratory: Respirations even and unlabored, normal respiratory rate Extremities: No gross deformities Skin: Skin color, texture, turgor normal. No rashes seen  Psych: Appropriate mood and affect. Neurologic: Mental status: Alert, oriented to person, place, and time, thought  content appropriate.     Assessment & Plan    1. Low testosterone in male Counseled on various potential adverse effects and potential benefits of testosterone replacement. He is going to take it into consideration and check with insurance. Given printed prescription to fill if he decides to take and to recheck labs 1 month after starting medication. .  - Testosterone 2 MG/24HR PT24; Place 1 patch onto the skin daily.  Dispense: 30 patch; Refill: 2     Lelon Huh, MD  [facility]

## 2019-01-13 NOTE — Patient Instructions (Signed)
.   If you start the testosterone patch, call after 1 months to check testosterone levels.

## 2019-02-25 ENCOUNTER — Ambulatory Visit (INDEPENDENT_AMBULATORY_CARE_PROVIDER_SITE_OTHER): Payer: Medicare Other | Admitting: Family Medicine

## 2019-02-25 ENCOUNTER — Other Ambulatory Visit: Payer: Self-pay

## 2019-02-25 DIAGNOSIS — Z23 Encounter for immunization: Secondary | ICD-10-CM

## 2019-02-25 NOTE — Patient Instructions (Signed)
.   Please review the attached list of medications and notify my office if there are any errors.   . Please bring all of your medications to every appointment so we can make sure that our medication list is the same as yours.   

## 2019-03-15 ENCOUNTER — Other Ambulatory Visit: Payer: Self-pay | Admitting: Family Medicine

## 2019-06-22 ENCOUNTER — Ambulatory Visit: Payer: Medicare Other | Admitting: Family Medicine

## 2019-08-06 DIAGNOSIS — Z23 Encounter for immunization: Secondary | ICD-10-CM | POA: Diagnosis not present

## 2019-08-25 ENCOUNTER — Ambulatory Visit: Payer: Medicare Other

## 2019-08-29 ENCOUNTER — Other Ambulatory Visit: Payer: Self-pay | Admitting: Family Medicine

## 2019-10-13 DIAGNOSIS — H903 Sensorineural hearing loss, bilateral: Secondary | ICD-10-CM | POA: Diagnosis not present

## 2019-10-13 DIAGNOSIS — H6123 Impacted cerumen, bilateral: Secondary | ICD-10-CM | POA: Diagnosis not present

## 2019-11-11 ENCOUNTER — Other Ambulatory Visit: Payer: Self-pay

## 2019-12-30 ENCOUNTER — Telehealth: Payer: Medicare Other | Admitting: Nurse Practitioner

## 2019-12-30 DIAGNOSIS — R05 Cough: Secondary | ICD-10-CM

## 2019-12-30 DIAGNOSIS — R059 Cough, unspecified: Secondary | ICD-10-CM

## 2019-12-30 DIAGNOSIS — R0981 Nasal congestion: Secondary | ICD-10-CM

## 2019-12-30 MED ORDER — BENZONATATE 100 MG PO CAPS: 100.0000 mg | ORAL_CAPSULE | Freq: Three times a day (TID) | ORAL | 0 refills | Status: DC | PRN

## 2019-12-30 NOTE — Progress Notes (Signed)
E-Visit for Corona Virus Screening  Your current symptoms could be consistent with the coronavirus.  Many health care providers can now test patients at their office but not all are.  [place] has multiple testing sites. For information on our COVID testing locations and hours go to [email or web address] * you wil have to be tested for covid first. If that coes back negative then you can make an appointmnet for a complete physical to have all of that checked.you will need ot contact your cardiologist or your PCP aout abnormal heart rate. We are enrolling you in our [place] for [place] . Daily you will receive a questionnaire within the [place] website. Our COVID 19 response team will be monitoring your responses daily.  Testing Information: The COVID-19 Community Testing sites will begin testing BY APPOINTMENT ONLY.  You can schedule online at [email or web address]  If you do not have access to a smart phone or computer you may call [phone] for an appointment.  Testing Locations: Appointment schedule is 8 am to 3:30 pm at all sites  [facility] indoors at [address] [place] [address] [facility]  indoors at [place]. [address] [place], [place] [address] [place] indoors at [address], [place] [address]  Additional testing sites in the Community:  . For [facility] Testing sites in [facility]  FaceUpdate.uy  . For Pop-up testing sites in [place]  BowlDirectory.co.uk  . For Testing sites with regular hours [email or web address]  . For [place] [email or web address]  . For [facility] [email or web address]  . For [facility] testing in  [place] and [employer] [email or web address]  . For [facility] testing in [facility]   [email or web address]  For  more information about community testing call [phone]   We are enrolling you in our [place] for [place] . Daily you will receive a questionnaire within the [place] website. Our COVID 19 response team will be monitoring your responses daily.  Please quarantine yourself while awaiting your test results. Please stay home for a minimum of 10 days from the first day of illness with improving symptoms and you have had 24 hours of no fever (without the use of Tylenol (Acetaminophen) Motrin (Ibuprofen) or any fever reducing medication).  Also - Do not get tested prior to returning to [facility] because once you have had a positive test the test can stay positive for more then a month in some cases.   You should wear a mask or cloth face covering over your nose and mouth if you must be around other people or animals, including pets (even at home). Try to stay at least 6 feet away from other people. This will protect the people around you.  Please continue good preventive care measures, including:  frequent hand-washing, avoid touching your face, cover coughs/sneezes, stay out of crowds and keep a 6 foot distance from others.  COVID-19 is a respiratory illness with symptoms that are similar to the flu. Symptoms are typically mild to moderate, but there have been cases of severe illness and death due to the virus.   The following symptoms may appear 2-14 days after exposure: . Fever . Cough . Shortness of breath or difficulty breathing . Chills . Repeated shaking with chills . Muscle pain . Headache . Sore throat . New loss of taste or smell . Fatigue . Congestion or runny nose . Nausea or vomiting . Diarrhea  Go to the nearest hospital ED for  assessment if  fever/cough/breathlessness are severe or illness seems like a threat to life.  It is vitally important that if you feel that you have an infection such as this virus or any other virus that you stay home and away from places where you may spread it to others.  You should avoid contact with people age 53 and older.   You can use medication such as A prescription cough medication called Tessalon Perles 100 mg. You may take 1-2 capsules every 8 hours as needed for cough  You may also take acetaminophen (Tylenol) as needed for fever.  Reduce your risk of any infection by using the same precautions used for avoiding the common cold or flu:  Marland Kitchen Wash your hands often with soap and warm water for at least 20 seconds.  If soap and water are not readily available, use an alcohol-based hand sanitizer with at least 60% alcohol.  . If coughing or sneezing, cover your mouth and nose by coughing or sneezing into the elbow areas of your shirt or coat, into a tissue or into your sleeve (not your hands). . Avoid shaking hands with others and consider head nods or verbal greetings only. . Avoid touching your eyes, nose, or mouth with unwashed hands.  . Avoid close contact with people who are sick. . Avoid places or events with large numbers of people in one location, like concerts or sporting events. . Carefully consider travel plans you have or are making. . If you are planning any travel outside or inside the [place], visit the CDC's Travelers' Health webpage for the latest health notices. . If you have some symptoms but not all symptoms, continue to monitor at home and seek medical attention if your symptoms worsen. . If you are having a medical emergency, call 911.  HOME CARE . Only take medications as instructed by your medical team. . Drink plenty of fluids and get plenty of rest. . A steam or ultrasonic humidifier can help if you have congestion.   GET HELP RIGHT AWAY IF YOU HAVE EMERGENCY WARNING SIGNS** FOR  COVID-19. If you or someone is showing any of these signs seek emergency medical care immediately. Call 911 or proceed to your closest emergency facility if: . You develop worsening high fever. . Trouble breathing . Bluish lips or face . Persistent pain or pressure in the chest . New confusion . Inability to wake or stay awake . You cough up blood. . Your symptoms become more severe  **This list is not all possible symptoms. Contact your medical provider for any symptoms that are sever or concerning to you.  MAKE SURE YOU   Understand these instructions.  Will watch your condition.  Will get help right away if you are not doing well or get worse.  Your e-visit answers were reviewed by a board certified advanced clinical practitioner to complete your personal care plan.  Depending on the condition, your plan could have included both over the counter or prescription medications.  If there is a problem please reply once you have received a response from your provider.  Your safety is important to [place].  If you have drug allergies check your prescription carefully.    You can use MyChart to ask questions about today's visit, request a non-urgent call back, or ask for a work or school excuse for 24 hours related to this e-Visit. If it has been greater than 24 hours you will need to follow up with your provider, or  enter a new e-Visit to address those concerns. You will get an e-mail in the next two days asking about your experience.  I hope that your e-visit has been valuable and will speed your recovery. Thank you for using e-visits.   5-10 minutes spent reviewing and documenting in chart.

## 2019-12-31 ENCOUNTER — Ambulatory Visit: Payer: Medicare Other | Attending: Internal Medicine

## 2019-12-31 DIAGNOSIS — Z20822 Contact with and (suspected) exposure to covid-19: Secondary | ICD-10-CM | POA: Diagnosis not present

## 2020-01-01 LAB — NOVEL CORONAVIRUS, NAA: SARS-CoV-2, NAA: NOT DETECTED

## 2020-01-02 ENCOUNTER — Emergency Department: Payer: Medicare Other

## 2020-01-02 ENCOUNTER — Other Ambulatory Visit: Payer: Self-pay

## 2020-01-02 ENCOUNTER — Encounter: Payer: Self-pay | Admitting: Emergency Medicine

## 2020-01-02 ENCOUNTER — Inpatient Hospital Stay
Admission: EM | Admit: 2020-01-02 | Discharge: 2020-01-05 | DRG: 645 | Disposition: A | Discharge status: Home Health | Payer: Medicare Other | Attending: Internal Medicine | Admitting: Internal Medicine

## 2020-01-02 DIAGNOSIS — E871 Hypo-osmolality and hyponatremia: Secondary | ICD-10-CM

## 2020-01-02 DIAGNOSIS — Z8249 Family history of ischemic heart disease and other diseases of the circulatory system: Secondary | ICD-10-CM | POA: Diagnosis not present

## 2020-01-02 DIAGNOSIS — I160 Hypertensive urgency: Secondary | ICD-10-CM | POA: Diagnosis present

## 2020-01-02 DIAGNOSIS — Z86010 Personal history of colonic polyps: Secondary | ICD-10-CM | POA: Diagnosis not present

## 2020-01-02 DIAGNOSIS — E039 Hypothyroidism, unspecified: Secondary | ICD-10-CM | POA: Diagnosis present

## 2020-01-02 DIAGNOSIS — Z7982 Long term (current) use of aspirin: Secondary | ICD-10-CM | POA: Diagnosis not present

## 2020-01-02 DIAGNOSIS — K5900 Constipation, unspecified: Secondary | ICD-10-CM | POA: Diagnosis present

## 2020-01-02 DIAGNOSIS — Z8042 Family history of malignant neoplasm of prostate: Secondary | ICD-10-CM

## 2020-01-02 DIAGNOSIS — E876 Hypokalemia: Secondary | ICD-10-CM | POA: Diagnosis present

## 2020-01-02 DIAGNOSIS — R531 Weakness: Secondary | ICD-10-CM | POA: Diagnosis not present

## 2020-01-02 DIAGNOSIS — I4510 Unspecified right bundle-branch block: Secondary | ICD-10-CM | POA: Diagnosis present

## 2020-01-02 DIAGNOSIS — Z20822 Contact with and (suspected) exposure to covid-19: Secondary | ICD-10-CM | POA: Diagnosis present

## 2020-01-02 DIAGNOSIS — E222 Syndrome of inappropriate secretion of antidiuretic hormone: Principal | ICD-10-CM | POA: Diagnosis present

## 2020-01-02 DIAGNOSIS — Z79899 Other long term (current) drug therapy: Secondary | ICD-10-CM | POA: Diagnosis not present

## 2020-01-02 DIAGNOSIS — Z820 Family history of epilepsy and other diseases of the nervous system: Secondary | ICD-10-CM | POA: Diagnosis not present

## 2020-01-02 DIAGNOSIS — I10 Essential (primary) hypertension: Secondary | ICD-10-CM | POA: Diagnosis not present

## 2020-01-02 DIAGNOSIS — Z79890 Hormone replacement therapy (postmenopausal): Secondary | ICD-10-CM | POA: Diagnosis not present

## 2020-01-02 DIAGNOSIS — Z85828 Personal history of other malignant neoplasm of skin: Secondary | ICD-10-CM | POA: Diagnosis not present

## 2020-01-02 DIAGNOSIS — E878 Other disorders of electrolyte and fluid balance, not elsewhere classified: Secondary | ICD-10-CM | POA: Diagnosis present

## 2020-01-02 DIAGNOSIS — E861 Hypovolemia: Secondary | ICD-10-CM | POA: Diagnosis present

## 2020-01-02 LAB — BASIC METABOLIC PANEL
Anion gap: 13 (ref 5–15)
BUN: 24 mg/dL — ABNORMAL HIGH (ref 8–23)
CO2: 24 mmol/L (ref 22–32)
Calcium: 8.5 mg/dL — ABNORMAL LOW (ref 8.9–10.3)
Chloride: 79 mmol/L — ABNORMAL LOW (ref 98–111)
Creatinine, Ser: 0.87 mg/dL (ref 0.61–1.24)
GFR calc Af Amer: >60 mL/min (ref >60)
GFR calc non Af Amer: >60 mL/min (ref >60)
Glucose, Bld: 149 mg/dL — ABNORMAL HIGH (ref 70–99)
Potassium: 3.1 mmol/L — ABNORMAL LOW (ref 3.5–5.1)
Sodium: 116 mmol/L — CL (ref 135–145)

## 2020-01-02 LAB — SODIUM, URINE, RANDOM: Sodium, Ur: 77 mmol/L

## 2020-01-02 LAB — URINALYSIS, COMPLETE (UACMP) WITH MICROSCOPIC
Bacteria, UA: NONE SEEN
Bilirubin Urine: NEGATIVE
Glucose, UA: NEGATIVE mg/dL
Hgb urine dipstick: NEGATIVE
Ketones, ur: 20 mg/dL — AB
Leukocytes,Ua: NEGATIVE
Nitrite: NEGATIVE
Protein, ur: 30 mg/dL — AB
Specific Gravity, Urine: 1.02 (ref 1.005–1.030)
Squamous Epithelial / HPF: NONE SEEN (ref 0–5)
pH: 6 (ref 5.0–8.0)

## 2020-01-02 LAB — CBC
HCT: 42.8 % (ref 39.0–52.0)
Hemoglobin: 16 g/dL (ref 13.0–17.0)
MCH: 32.1 pg (ref 26.0–34.0)
MCHC: 37.4 g/dL — ABNORMAL HIGH (ref 30.0–36.0)
MCV: 85.9 fL (ref 80.0–100.0)
Platelets: 255 10*3/uL (ref 150–400)
RBC: 4.98 MIL/uL (ref 4.22–5.81)
RDW: 11.1 % — ABNORMAL LOW (ref 11.5–15.5)
WBC: 7.2 10*3/uL (ref 4.0–10.5)
nRBC: 0 % (ref 0.0–0.2)

## 2020-01-02 LAB — TSH: TSH: 2.657 u[IU]/mL (ref 0.350–4.500)

## 2020-01-02 LAB — PHOSPHORUS: Phosphorus: 2.5 mg/dL (ref 2.5–4.6)

## 2020-01-02 LAB — OSMOLALITY, URINE: Osmolality, Ur: 635 mOsm/kg (ref 300–900)

## 2020-01-02 LAB — URIC ACID: Uric Acid, Serum: 5.7 mg/dL (ref 3.7–8.6)

## 2020-01-02 LAB — MAGNESIUM: Magnesium: 1.9 mg/dL (ref 1.7–2.4)

## 2020-01-02 MED ORDER — LEVOTHYROXINE SODIUM 50 MCG PO TABS
25.0000 ug | ORAL_TABLET | Freq: Every day | ORAL | Status: DC
  Administered 2020-01-03 – 2020-01-05 (×3): 25 ug via ORAL
  Filled 2020-01-02 (×3): qty 1

## 2020-01-02 MED ORDER — TESTOSTERONE 2 MG/24HR TD PT24: 1.0000 | MEDICATED_PATCH | Freq: Every day | TRANSDERMAL | Status: DC

## 2020-01-02 MED ORDER — SODIUM CHLORIDE 0.9 % IV SOLN: INTRAVENOUS | Status: DC

## 2020-01-02 MED ORDER — POTASSIUM CHLORIDE 20 MEQ PO PACK
40.0000 meq | PACK | Freq: Once | ORAL | Status: AC
  Administered 2020-01-03: 02:00:00 40 meq via ORAL
  Filled 2020-01-02: qty 2

## 2020-01-02 MED ORDER — POLYETHYLENE GLYCOL 3350 17 G PO PACK: 17.0000 g | PACK | Freq: Every day | ORAL | Status: DC | PRN

## 2020-01-02 MED ORDER — ADULT MULTIVITAMIN W/MINERALS CH
1.0000 | ORAL_TABLET | Freq: Every day | ORAL | Status: DC
  Administered 2020-01-03 – 2020-01-05 (×3): 1 via ORAL
  Filled 2020-01-02 (×3): qty 1

## 2020-01-02 MED ORDER — LABETALOL HCL 5 MG/ML IV SOLN
20.0000 mg | INTRAVENOUS | Status: DC | PRN
  Administered 2020-01-03 – 2020-01-04 (×2): 20 mg via INTRAVENOUS
  Filled 2020-01-02 (×2): qty 4

## 2020-01-02 MED ORDER — BENZONATATE 100 MG PO CAPS: 100.0000 mg | ORAL_CAPSULE | Freq: Three times a day (TID) | ORAL | Status: DC | PRN

## 2020-01-02 MED ORDER — ASPIRIN EC 81 MG PO TBEC
81.0000 mg | DELAYED_RELEASE_TABLET | Freq: Every day | ORAL | Status: DC
  Administered 2020-01-03 – 2020-01-05 (×3): 81 mg via ORAL
  Filled 2020-01-02 (×3): qty 1

## 2020-01-02 MED ORDER — ONDANSETRON HCL 4 MG/2ML IJ SOLN: 4.0000 mg | Freq: Four times a day (QID) | INTRAMUSCULAR | Status: DC | PRN

## 2020-01-02 MED ORDER — LISINOPRIL 20 MG PO TABS
20.0000 mg | ORAL_TABLET | Freq: Every day | ORAL | Status: DC
  Administered 2020-01-03 – 2020-01-05 (×3): 20 mg via ORAL
  Filled 2020-01-02: qty 1
  Filled 2020-01-02: qty 2
  Filled 2020-01-02 (×3): qty 1

## 2020-01-02 MED ORDER — TRAZODONE HCL 50 MG PO TABS
25.0000 mg | ORAL_TABLET | Freq: Every evening | ORAL | Status: DC | PRN
  Administered 2020-01-03: 25 mg via ORAL
  Filled 2020-01-02 (×2): qty 1

## 2020-01-02 MED ORDER — ACETAMINOPHEN 650 MG RE SUPP: 650.0000 mg | Freq: Four times a day (QID) | RECTAL | Status: DC | PRN

## 2020-01-02 MED ORDER — VERAPAMIL HCL ER 120 MG PO TBCR
120.0000 mg | EXTENDED_RELEASE_TABLET | Freq: Every day | ORAL | Status: DC
  Administered 2020-01-03 – 2020-01-05 (×3): 120 mg via ORAL
  Filled 2020-01-02 (×4): qty 1

## 2020-01-02 MED ORDER — SODIUM CHLORIDE 0.9 % IV BOLUS
500.0000 mL | Freq: Once | INTRAVENOUS | Status: AC
  Administered 2020-01-02: 500 mL via INTRAVENOUS

## 2020-01-02 MED ORDER — ENOXAPARIN SODIUM 40 MG/0.4ML ~~LOC~~ SOLN
40.0000 mg | SUBCUTANEOUS | Status: DC
  Administered 2020-01-03 – 2020-01-04 (×3): 40 mg via SUBCUTANEOUS
  Filled 2020-01-02 (×3): qty 0.4

## 2020-01-02 MED ORDER — ONDANSETRON HCL 4 MG PO TABS: 4.0000 mg | ORAL_TABLET | Freq: Four times a day (QID) | ORAL | Status: DC | PRN

## 2020-01-02 MED ORDER — ACETAMINOPHEN 325 MG PO TABS: 650.0000 mg | ORAL_TABLET | Freq: Four times a day (QID) | ORAL | Status: DC | PRN

## 2020-01-02 NOTE — ED Notes (Signed)
Pt tolerating liquids well at this time. Pt does not cough when swallowing water.

## 2020-01-02 NOTE — ED Notes (Signed)
Date and time results received: 01/02/20 8:42 PM  (use smartphrase ".now" to insert current time)  Test: Na+ Critical Value: 114  Name of Provider Notified: Isaacs  Orders Received? Or Actions Taken?: Orders Received - See Orders for details

## 2020-01-02 NOTE — ED Provider Notes (Signed)
[facility] Provider Note ____________________________________________   First MD Initiated Contact with Patient 01/02/20 2056     (approximate)  I have reviewed the triage vital signs and the nursing notes.   HISTORY  Chief Complaint Hypertension    HPI Travis Austin is a 86 y.o. male with PMH as noted below including hypertension, hypothyroidism, and hyponatremia who presents with a concern for elevated blood pressures as high as 170s over 90s at home over the last several days.  In addition, he has felt somewhat weak and tired, and his granddaughter reports that he has been staggering when trying to walk more than normal.  He has had no confusion or change in his mental status.  He denies any chest pain or difficulty breathing.  He states he feels like he has a cold, but without specific symptoms of fever, nasal congestion, or sore throat; he just feels "rundown."  Past Medical History:  Diagnosis Date  . Cancer ([place])    SKIN  . Hearing aid worn    bilateral  . Heart murmur    CHILDHOOD  . History of adenomatous polyp of colon   . Hypertension     Patient Active Problem List   Diagnosis Date Noted  . Hypothyroid 02/11/2018  . BPH (benign prostatic hypertrophy) 01/20/2016  . Hypertension 01/20/2016  . History of adenomatous polyp of colon 04/07/2012    Past Surgical History:  Procedure Laterality Date  . CATARACT EXTRACTION W/PHACO Left 08/13/2017   Procedure: CATARACT EXTRACTION PHACO AND INTRAOCULAR LENS PLACEMENT ([place]) LEFT;  Surgeon: Eulogio Bear, MD;  Location: [facility];  Service: Ophthalmology;  Laterality: Left;  . CATARACT EXTRACTION W/PHACO Right 10/31/2017   Procedure: CATARACT EXTRACTION PHACO AND INTRAOCULAR LENS PLACEMENT ([facility]);  Surgeon: Eulogio Bear, MD;  Location: [facility];  Service: Ophthalmology;  Laterality: Right;  Lot: [id number] J [place]: 00:58.3 AP%: 14.0 CDE: 8.11  . COLONOSCOPY       Prior to Admission medications   Medication Sig Start Date End Date Taking? Authorizing Provider  aspirin EC 81 MG tablet Take 81 mg by mouth daily.     [provider]  benzonatate (TESSALON PERLES) 100 MG capsule Take 1 capsule (100 mg total) by mouth 3 (three) times daily as needed. 12/30/19   Hassell Done, Mary-Margaret, FNP  indapamide (LOZOL) 1.25 MG tablet Take 1 tablet (1.25 mg total) by mouth daily. 12/26/18   Birdie Sons, MD  lisinopril (PRINIVIL,ZESTRIL) 20 MG tablet TAKE 1 TABLET DAILY 03/15/19   Birdie Sons, MD  Multiple Vitamin (MULTIVITAMIN WITH MINERALS) TABS tablet Take 1 tablet daily by mouth.    [provider]  SYNTHROID 25 MCG tablet TAKE 1 TABLET DAILY BEFORE BREAKFAST 03/15/19   Birdie Sons, MD  Testosterone 2 MG/24HR PT24 Place 1 patch onto the skin daily. 01/13/19   Birdie Sons, MD  verapamil (VERELAN PM) 120 MG 24 hr capsule TAKE 1 CAPSULE DAILY. 08/29/19   Birdie Sons, MD    Allergies Alpha-gal  Family History  Problem Relation Age of Onset  . Arthritis Mother   . Parkinson's disease Mother   . Heart disease Father   . Hypertension Father   . Cancer Brother        prostate    Social History Social History   Tobacco Use  . Smoking status: Never Smoker  . Smokeless tobacco: Never Used  Substance Use Topics  . Alcohol use: Yes    Alcohol/week:  0.0 standard drinks    Comment: 1 drink a day, any kind  . Drug use: No    Review of Systems  Constitutional: No fever.  Positive for weakness. Eyes: No redness. ENT: No sore throat.  No rhinorrhea. Cardiovascular: Denies chest pain. Respiratory: Denies shortness of breath. Gastrointestinal: No vomiting or diarrhea.  Genitourinary: Negative for dysuria.  Musculoskeletal: Negative for back pain. Skin: Negative for rash. Neurological: Negative for headache.   ____________________________________________   PHYSICAL EXAM:  VITAL SIGNS: ED Triage Vitals  Enc Vitals  Group     BP 01/02/20 1938 (!) 172/70     Pulse Rate 01/02/20 1938 78     Resp 01/02/20 1938 18     Temp 01/02/20 1938 98.1 F (36.7 C)     Temp Source 01/02/20 1938 Oral     SpO2 01/02/20 1938 97 %     Weight 01/02/20 1939 155 lb (70.3 kg)     Height 01/02/20 1939 5' 8.5" (1.74 m)     Head Circumference --      Peak Flow --      Pain Score 01/02/20 1937 0     Pain Loc --      Pain Edu? --      Excl. in [place]? --     Constitutional: Alert and oriented.  Relatively well appearing and in no acute distress. Eyes: Conjunctivae are normal.  EOMI. Head: Atraumatic. Nose: No congestion/rhinnorhea. Mouth/Throat: Mucous membranes are moist.  Oropharynx clear with no swelling or pooled secretions.  No stridor. Neck: Normal range of motion.  Cardiovascular: Normal rate, regular rhythm. Good peripheral circulation. Respiratory: Normal respiratory effort.  No retractions. Gastrointestinal:  No distention.  Musculoskeletal: No lower extremity edema.  Extremities warm and well perfused.  Neurologic:  Normal speech and language.  Motor intact in all extremities.  Normal coordination. Skin:  Skin is warm and dry. No rash noted. Psychiatric: Mood and affect are normal. Speech and behavior are normal.  ____________________________________________   LABS (all labs ordered are listed, but only abnormal results are displayed)  Labs Reviewed  BASIC METABOLIC PANEL - Abnormal; Notable for the following components:      Result Value   Sodium 116 (*)    Potassium 3.1 (*)    Chloride 79 (*)    Glucose, Bld 149 (*)    BUN 24 (*)    Calcium 8.5 (*)    All other components within normal limits  CBC - Abnormal; Notable for the following components:   MCHC 37.4 (*)    RDW 11.1 (*)    All other components within normal limits  URINALYSIS, COMPLETE (UACMP) WITH MICROSCOPIC - Abnormal; Notable for the following components:   Color, Urine YELLOW (*)    APPearance CLEAR (*)    Ketones, ur 20 (*)     Protein, ur 30 (*)    All other components within normal limits  SODIUM, URINE, RANDOM  OSMOLALITY, URINE  MAGNESIUM  PHOSPHORUS  URIC ACID  TSH  CORTISOL   ____________________________________________  EKG  ED ECG REPORT I, Arta Silence, the attending physician, personally viewed and interpreted this ECG.  Date: 01/02/2020 EKG Time: 1947 Rate: 77 Rhythm: normal sinus rhythm QRS Axis: normal Intervals: RBBB ST/T Wave abnormalities: normal Narrative Interpretation: no evidence of acute ischemia  ____________________________________________  RADIOLOGY  CXR: No acute abnormality  ____________________________________________   PROCEDURES  Procedure(s) performed: No  Procedures  Critical Care performed: Yes  CRITICAL CARE Performed by: Arta Silence   Total  critical care time: 30 minutes  Critical care time was exclusive of separately billable procedures and treating other patients.  Critical care was necessary to treat or prevent imminent or life-threatening deterioration.  Critical care was time spent personally by me on the following activities: development of treatment plan with patient and/or surrogate as well as nursing, discussions with consultants, evaluation of patient's response to treatment, examination of patient, obtaining history from patient or surrogate, ordering and performing treatments and interventions, ordering and review of laboratory studies, ordering and review of radiographic studies, pulse oximetry and re-evaluation of patient's condition. ____________________________________________   INITIAL IMPRESSION / ASSESSMENT AND PLAN / ED COURSE  Pertinent labs & imaging results that were available during my care of the patient were reviewed by me and considered in my medical decision making (see chart for details).  86 year old male with PMH as noted above presents with elevated blood pressure and some generalized weakness over  approximate last week.  He has also had some increased difficulty walking, however his granddaughter states that he has not had any confusion or change in his mental status.  On exam the patient is overall well-appearing for his age, and alert and oriented.  His vital signs are normal except for hypertension.  Neurologic exam is nonfocal.  The remainder of the physical exam is unremarkable, except that the patient intermittently will make a wet gurgling type sound from his throat when he swallows.  However, he is handling his secretions normally, has no stridor, no shortness of breath, and denies any throat discomfort.  The oropharynx appears clear.  I do not see any evidence of dysphagia or acute airway issue.  Lab work-up obtained from triage is significant for severe hyponatremia.  I reviewed the past medical records in [place].  The patient was diagnosed with hyponatremia to the 120s about a year ago, thought to be possibly due to hypothyroidism or diuretics.  However, his most recent sodium level from 1 year ago was 131.  Overall the presentation is most consistent with symptoms related to hyponatremia, although the etiology of the hyponatremia is unclear.  Given the likely gradual onset, as well as the lack of mental status change or seizure, there is no indication for hypertonic saline at this time.  I will give a fluid bolus of normal saline and recheck the sodium.  The patient will need admission.   At this time, the blood pressure is high but not concerning for hypertensive urgency and does not require immediate intervention.  ----------------------------------------- 10:37 PM on 01/02/2020 -----------------------------------------  I discussed the case with Dr. Sidney Ace for admission to the hospitalist service.  ____________________________________________   FINAL CLINICAL IMPRESSION(S) / ED DIAGNOSES  Final diagnoses:  Hyponatremia      NEW MEDICATIONS STARTED DURING THIS VISIT:   New Prescriptions   No medications on file     Note:  This document was prepared using Dragon voice recognition software and may include unintentional dictation errors.    Arta Silence, MD 01/02/20 2237

## 2020-01-02 NOTE — H&P (Addendum)
[facility] NAME: Travis Austin    MR#:  [id number]  DATE OF BIRTH:  10-27-1931  DATE OF ADMISSION:  01/02/2020  PRIMARY CARE PHYSICIAN: Birdie Sons, MD   REQUESTING/REFERRING PHYSICIAN: Arta Silence, MD CHIEF COMPLAINT:   Chief Complaint  Patient presents with  . Hypertension    HISTORY OF PRESENT ILLNESS:  Travis Austin  is a 86 y.o. male with a known history of multiple medical problems that will be mentioned below, who presented to the emergency room with a acute onset of elevated blood pressure with recent generalized weakness.  He denied any headache or dizziness or blurred vision or paresthesias or focal muscle weakness.  He admits to constipation denies any abdominal pain or melena or bright red blood per rectum.  No cough or wheezing or dyspnea.  No recent exposure to COVID-19.  He denied any increased recent intake of water.  Upon presentation to the emergency room, blood pressure was 172/70 with otherwise normal vital signs.  Blood pressure has been as high as 188/91.Labs were remarkable for hypokalemia of 3.1 and hyponatremia of 116 with hypochloremia of 79.  TSH was normal at 2.6.  Urine osmolality was 635 and urine sodium 77.  Chest x-ray showed no acute cardiopulmonary disease.  EKG showed sinus rhythm with a rate of 77 with PACs and right bundle branch block.  The patient was given 500 mL IV normal saline bolus.  He will be admitted to a medical monitored bed for further evaluation and management. PAST MEDICAL HISTORY:   Past Medical History:  Diagnosis Date  . Cancer ([place])    SKIN  . Hearing aid worn    bilateral  . Heart murmur    CHILDHOOD  . History of adenomatous polyp of colon   . Hypertension     PAST SURGICAL HISTORY:   Past Surgical History:  Procedure Laterality Date  . CATARACT EXTRACTION W/PHACO Left 08/13/2017   Procedure: CATARACT EXTRACTION PHACO AND INTRAOCULAR LENS PLACEMENT ([place]) LEFT;  Surgeon:  Eulogio Bear, MD;  Location: [facility];  Service: Ophthalmology;  Laterality: Left;  . CATARACT EXTRACTION W/PHACO Right 10/31/2017   Procedure: CATARACT EXTRACTION PHACO AND INTRAOCULAR LENS PLACEMENT ([facility]);  Surgeon: Eulogio Bear, MD;  Location: [facility];  Service: Ophthalmology;  Laterality: Right;  Lot: [id number] J [place]: 00:58.3 AP%: 14.0 CDE: 8.11  . COLONOSCOPY      SOCIAL HISTORY:   Social History   Tobacco Use  . Smoking status: Never Smoker  . Smokeless tobacco: Never Used  Substance Use Topics  . Alcohol use: Yes    Alcohol/week: 0.0 standard drinks    Comment: 1 drink a day, any kind    FAMILY HISTORY:   Family History  Problem Relation Age of Onset  . Arthritis Mother   . Parkinson's disease Mother   . Heart disease Father   . Hypertension Father   . Cancer Brother        prostate    DRUG ALLERGIES:   Allergies  Allergen Reactions  . Alpha-Gal     (meat allergy from tick bite)    REVIEW OF SYSTEMS:   ROS As per history of present illness. All pertinent systems were reviewed above. Constitutional,  HEENT, cardiovascular, respiratory, GI, GU, musculoskeletal, neuro, psychiatric, endocrine,  integumentary and hematologic systems were reviewed and are otherwise  negative/unremarkable except for positive findings mentioned above in the HPI.   MEDICATIONS AT HOME:   Prior to  Admission medications   Medication Sig Start Date End Date Taking? Authorizing Provider  aspirin EC 81 MG tablet Take 81 mg by mouth daily.     [provider]  benzonatate (TESSALON PERLES) 100 MG capsule Take 1 capsule (100 mg total) by mouth 3 (three) times daily as needed. 12/30/19   Hassell Done, Mary-Margaret, FNP  indapamide (LOZOL) 1.25 MG tablet Take 1 tablet (1.25 mg total) by mouth daily. 12/26/18   Birdie Sons, MD  lisinopril (PRINIVIL,ZESTRIL) 20 MG tablet TAKE 1 TABLET DAILY 03/15/19   Birdie Sons, MD  Multiple Vitamin (MULTIVITAMIN WITH  MINERALS) TABS tablet Take 1 tablet daily by mouth.    [provider]  SYNTHROID 25 MCG tablet TAKE 1 TABLET DAILY BEFORE BREAKFAST 03/15/19   Birdie Sons, MD  Testosterone 2 MG/24HR PT24 Place 1 patch onto the skin daily. 01/13/19   Birdie Sons, MD  verapamil (VERELAN PM) 120 MG 24 hr capsule TAKE 1 CAPSULE DAILY. 08/29/19   Birdie Sons, MD      VITAL SIGNS:  Blood pressure (!) 180/74, pulse 99, temperature 98.1 F (36.7 C), temperature source Oral, resp. rate 18, height 5' 8.5" (1.74 m), weight 70.3 kg, SpO2 91 %.  PHYSICAL EXAMINATION:  Physical Exam  GENERAL:  86 y.o.-year-old patient Caucasian male lying in the bed with no acute distress.  EYES: Pupils equal, round, reactive to light and accommodation. No scleral icterus. Extraocular muscles intact.  HEENT: Head atraumatic, normocephalic. Oropharynx and nasopharynx clear.  NECK:  Supple, no jugular venous distention. No thyroid enlargement, no tenderness.  LUNGS: Normal breath sounds bilaterally, no wheezing, rales,rhonchi or crepitation. No use of accessory muscles of respiration.  CARDIOVASCULAR: Regular rate and rhythm, S1, S2 normal. No murmurs, rubs, or gallops.  ABDOMEN: Soft, nondistended, nontender. Bowel sounds present. No organomegaly or mass.  EXTREMITIES: No pedal edema, cyanosis, or clubbing.  NEUROLOGIC: Cranial nerves II through XII are intact except for mild hearing loss. Muscle strength 5/5 in all extremities. Sensation intact. Gait not checked.  PSYCHIATRIC: The patient is alert and oriented x 3.  Normal affect and good eye contact. SKIN: No obvious rash, lesion, or ulcer.   LABORATORY PANEL:   CBC Recent Labs  Lab 01/02/20 1954  WBC 7.2  HGB 16.0  HCT 42.8  PLT 255   ------------------------------------------------------------------------------------------------------------------  Chemistries  Recent Labs  Lab 01/02/20 1954  NA 116*  K 3.1*  CL 79*  CO2 24  GLUCOSE 149*    BUN 24*  CREATININE 0.87  CALCIUM 8.5*  MG 1.9   ------------------------------------------------------------------------------------------------------------------  Cardiac Enzymes No results for input(s): TROPONINI in the last 168 hours. ------------------------------------------------------------------------------------------------------------------  RADIOLOGY:  DG Chest Portable 1 View  Result Date: 01/02/2020 CLINICAL DATA:  86 year old male with weakness. EXAM: PORTABLE CHEST 1 VIEW COMPARISON:  None. FINDINGS: No focal consolidation, pleural effusion, pneumothorax. The cardiac silhouette is within normal limits. The aorta is mildly tortuous. No acute osseous pathology. IMPRESSION: No active disease. Electronically Signed   By: Anner Crete M.D.   On: 01/02/2020 22:06      IMPRESSION AND PLAN:   1.  Symptomatic hyponatremia.  The patient will be admitted to medical monitored bed.  We will hydrate with IV normal saline and follow serial sodium levels.  This is likely hypovolemic given mild azotemia.  Hyponatremia work-up was ordered including serum osmolality in addition to  labs ordered in the ER.  Indapamide will be held off.  2.  Hypertensive urgency.  We will  continue verapamil SR and lisinopril while holding indapamide.  He will be placed on as needed IV labetalol.  3.  Hypokalemia.  Potassium will be replaced and magnesium will be optimized.  4.  Hypothyroidism.  We will continue Synthroid and check TSH level..  5.  DVT prophylaxis.  Subcutaneous Lovenox.   All the records are reviewed and case discussed with ED provider. The plan of care was discussed in details with the patient (and family). I answered all questions. The patient agreed to proceed with the above mentioned plan. Further management will depend upon hospital course.   CODE STATUS: Full code  TOTAL TIME TAKING CARE OF THIS PATIENT: 50 minutes.    Christel Mormon M.D on 01/02/2020 at 10:47 PM  [facility]   From 7 PM-7 AM, contact night-coverage [email or web address]  CC: Primary care physician; Birdie Sons, MD   Note: This dictation was prepared with Dragon dictation along with smaller phrase technology. Any transcriptional errors that result from this process are unintentional.

## 2020-01-02 NOTE — ED Triage Notes (Signed)
Pt arrived via POV with reports of elevated BP at home, states BP was 170s/90s. Pt states he was checking his BP because he "felt lousy".  Pt states he has felt like he has had a cold for the past week, Denies any COVID contacts.  Pt tested on Thursday and was negative.

## 2020-01-02 NOTE — ED Triage Notes (Signed)
CSS negative, neurologically intact, no droop, drift or aphasia. Pt presents via POV w/ family who stated he was weaker and staggered more when trying to get into the car to come to the hospital tonight. Pt is very hard of hearing.

## 2020-01-02 NOTE — ED Notes (Signed)
Pt walked to toilet and used urinal w minimal assistance.

## 2020-01-03 LAB — CBC
HCT: 41.7 % (ref 39.0–52.0)
Hemoglobin: 15.7 g/dL (ref 13.0–17.0)
MCH: 32.6 pg (ref 26.0–34.0)
MCHC: 37.6 g/dL — ABNORMAL HIGH (ref 30.0–36.0)
MCV: 86.5 fL (ref 80.0–100.0)
Platelets: 254 10*3/uL (ref 150–400)
RBC: 4.82 MIL/uL (ref 4.22–5.81)
RDW: 11.2 % — ABNORMAL LOW (ref 11.5–15.5)
WBC: 8.5 10*3/uL (ref 4.0–10.5)
nRBC: 0 % (ref 0.0–0.2)

## 2020-01-03 LAB — BASIC METABOLIC PANEL
Anion gap: 10 (ref 5–15)
BUN: 20 mg/dL (ref 8–23)
CO2: 23 mmol/L (ref 22–32)
Calcium: 8.3 mg/dL — ABNORMAL LOW (ref 8.9–10.3)
Chloride: 83 mmol/L — ABNORMAL LOW (ref 98–111)
Creatinine, Ser: 0.76 mg/dL (ref 0.61–1.24)
GFR calc Af Amer: >60 mL/min (ref >60)
GFR calc non Af Amer: >60 mL/min (ref >60)
Glucose, Bld: 135 mg/dL — ABNORMAL HIGH (ref 70–99)
Potassium: 4.3 mmol/L (ref 3.5–5.1)
Sodium: 116 mmol/L — CL (ref 135–145)

## 2020-01-03 LAB — SODIUM
Sodium: 119 mmol/L — CL (ref 135–145)
Sodium: 122 mmol/L — ABNORMAL LOW (ref 135–145)
Sodium: 122 mmol/L — ABNORMAL LOW (ref 135–145)

## 2020-01-03 LAB — NA AND K (SODIUM & POTASSIUM), RAND UR
Potassium Urine: 65 mmol/L
Sodium, Ur: 77 mmol/L

## 2020-01-03 LAB — CORTISOL: Cortisol, Plasma: 25.9 ug/dL

## 2020-01-03 LAB — OSMOLALITY: Osmolality: 260 mOsm/kg — ABNORMAL LOW (ref 275–295)

## 2020-01-03 NOTE — ED Notes (Signed)
Pt up to toilet w/o difficulty at this time.

## 2020-01-03 NOTE — Progress Notes (Signed)
Report Called to [employer] on [employer]

## 2020-01-03 NOTE — ED Notes (Signed)
Pt given meal tray and 120z coke to drink

## 2020-01-03 NOTE — ED Notes (Signed)
Pt provided lunch tray. Requested water to drink. Sitting up on bed.

## 2020-01-03 NOTE — ED Notes (Signed)
Pt belongings taken home w granddaughter including one pair of Deray Dawes shoes, pair of Jaiveon Suppes socks, and bag of patient medications.

## 2020-01-03 NOTE — Progress Notes (Signed)
PROGRESS NOTE    Travis Austin  [id number] DOB: [birth year] DOA: 01/02/2020 PCP: Birdie Sons, MD    Assessment & Plan:   Active Problems:   Hyponatremia    Travis Austin is a 86 y.o. Caucasian male with medical history significant for HTN and hypothyroidism who presented with elevated BP and recent generalized weakness.     1.  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  Pt felt generally weak.  Hyponatremia seemed to have started back in 2017, though usually in low 130's.  Pt said he was switched to a new diuretic indapamide about a year ago and believed it was the cause of his low Na level. --Urine Na 77, urine osm 635, strongly suggest SIADH (serum osm wasn't collected on admission). --NS 500 ml given and MIVF started on admission.  Na unchanged this morning at 116. PLAN: --d/c MIVF --Fluid restrict to 1200 cc per day --Na level check q6h --d/c indapamide    2.  Hypertensive urgency, improved --continue verapamil SR and lisinopril  --d/c indapamide.   --as needed IV labetalol.  3.  Hypokalemia.   Potassium will be replaced and magnesium will be optimized.  4.  Hypothyroidism.   --TSH wnl. --We will continue Synthroid    DVT prophylaxis: Lovenox SQ Code Status: Full code  Family Communication: not today Disposition Plan: Home in 1-2 days   Subjective and Interval History:  Pt reported feeling better.  No fever, dyspnea, chest pain, abdominal pain, N/V/D, dysuria, increased swelling.  Pt had been receiving MIVF, Na this morning remained the same at 116.  MIVF d/c'ed, fluid restriction ordered instead.   Objective: Vitals:   01/03/20 1700 01/03/20 1730 01/03/20 1800 01/03/20 1900  BP: 136/73 (!) 149/118 (!) 141/76 (!) 149/80  Pulse: 67 70 64 (!) 58  Resp: 20  (!) 21 20  Temp:      TempSrc:      SpO2: 99% 91% 98% 100%  Weight:      Height:        Intake/Output Summary (Last 24 hours) at  01/03/2020 1912 Last data filed at 01/03/2020 1747 Gross per 24 hour  Intake 500 ml  Output 200 ml  Net 300 ml   Filed Weights   01/02/20 1939  Weight: 70.3 kg    Examination:   Constitutional: NAD, AAOx3 HEENT: conjunctivae and lids normal, EOMI CV: RRR no M,R,G. Distal pulses +2.  No cyanosis.  RESP: CTA B/L, normal respiratory effort, on RA GI: +BS, NTND Extremities: No effusions, edema, or tenderness in BLE SKIN: warm, dry and intact Neuro: II - XII grossly intact.  Sensation intact Psych: Normal mood and affect.  Appropriate judgement and reason   Data Reviewed: I have personally reviewed following labs and imaging studies  CBC: Recent Labs  Lab 01/02/20 1954 01/03/20 0251  WBC 7.2 8.5  HGB 16.0 15.7  HCT 42.8 41.7  MCV 85.9 86.5  PLT 255 0000000   Basic Metabolic Panel: Recent Labs  Lab 01/02/20 1954 01/03/20 0251 01/03/20 1403 01/03/20 1748  NA 116* 116* 119* 122*  K 3.1* 4.3  --   --   CL 79* 83*  --   --   CO2 24 23  --   --   GLUCOSE 149* 135*  --   --   BUN 24* 20  --   --   CREATININE 0.87 0.76  --   --   CALCIUM 8.5*  8.3*  --   --   MG 1.9  --   --   --   PHOS 2.5  --   --   --    GFR: Estimated Creatinine Clearance: 65.3 mL/min (by C-G formula based on SCr of 0.76 mg/dL). Liver Function Tests: No results for input(s): AST, ALT, ALKPHOS, BILITOT, PROT, ALBUMIN in the last 168 hours. No results for input(s): LIPASE, AMYLASE in the last 168 hours. No results for input(s): AMMONIA in the last 168 hours. Coagulation Profile: No results for input(s): INR, PROTIME in the last 168 hours. Cardiac Enzymes: No results for input(s): CKTOTAL, CKMB, CKMBINDEX, TROPONINI in the last 168 hours. BNP (last 3 results) No results for input(s): PROBNP in the last 8760 hours. HbA1C: No results for input(s): HGBA1C in the last 72 hours. CBG: No results for input(s): GLUCAP in the last 168 hours. Lipid Profile: No results for input(s): CHOL, HDL, LDLCALC,  TRIG, CHOLHDL, LDLDIRECT in the last 72 hours. Thyroid Function Tests: Recent Labs    01/02/20 1954  TSH 2.657   Anemia Panel: No results for input(s): VITAMINB12, FOLATE, FERRITIN, TIBC, IRON, RETICCTPCT in the last 72 hours. Sepsis Labs: No results for input(s): PROCALCITON, LATICACIDVEN in the last 168 hours.  Recent Results (from the past 240 hour(s))  Novel Coronavirus, NAA ([facility])     Status: None   Collection Time: 12/31/19  1:00 PM   Specimen: Nasopharyngeal(NP) swabs in vial transport medium   NASOPHARYNGE  TESTING  Result Value Ref Range Status   SARS-CoV-2, NAA Not Detected Not Detected Final    Comment: This nucleic acid amplification test was developed and its performance characteristics determined by [employer]. Nucleic acid amplification tests include PCR and TMA. This test has not been FDA cleared or approved. This test has been authorized by FDA under an Emergency Use Authorization (EUA). This test is only authorized for the duration of time the declaration that circumstances exist justifying the authorization of the emergency use of in vitro diagnostic tests for detection of SARS-CoV-2 virus and/or diagnosis of COVID-19 infection under section 564(b)(1) of the Act, 21 U.S.C. [id number]) (1), unless the authorization is terminated or revoked sooner. When diagnostic testing is negative, the possibility of a false negative result should be considered in the context of a patient's recent exposures and the presence of clinical signs and symptoms consistent with COVID-19. An individual without symptoms of COVID-19 and who is not shedding SARS-CoV-2 virus would  expect to have a negative (not detected) result in this assay.       Radiology Studies: DG Chest Portable 1 View  Result Date: 01/02/2020 CLINICAL DATA:  86 year old male with weakness. EXAM: PORTABLE CHEST 1 VIEW COMPARISON:  None. FINDINGS: No focal consolidation, pleural effusion,  pneumothorax. The cardiac silhouette is within normal limits. The aorta is mildly tortuous. No acute osseous pathology. IMPRESSION: No active disease. Electronically Signed   By: Anner Crete M.D.   On: 01/02/2020 22:06     Scheduled Meds: . aspirin EC  81 mg Oral Daily  . enoxaparin (LOVENOX) injection  40 mg Subcutaneous Q24H  . levothyroxine  25 mcg Oral QAC breakfast  . lisinopril  20 mg Oral Daily  . multivitamin with minerals  1 tablet Oral Daily  . verapamil  120 mg Oral Daily   Continuous Infusions:   LOS: 1 day     Enzo Bi, MD [facility] If 7PM-7AM, please contact night-coverage 01/03/2020, 7:12 PM

## 2020-01-03 NOTE — ED Notes (Signed)
Admitting MD at bedside.

## 2020-01-04 DIAGNOSIS — E871 Hypo-osmolality and hyponatremia: Secondary | ICD-10-CM

## 2020-01-04 LAB — CBC
HCT: 38.5 % — ABNORMAL LOW (ref 39.0–52.0)
Hemoglobin: 14.1 g/dL (ref 13.0–17.0)
MCH: 32.2 pg (ref 26.0–34.0)
MCHC: 36.6 g/dL — ABNORMAL HIGH (ref 30.0–36.0)
MCV: 87.9 fL (ref 80.0–100.0)
Platelets: 212 10*3/uL (ref 150–400)
RBC: 4.38 MIL/uL (ref 4.22–5.81)
RDW: 11.4 % — ABNORMAL LOW (ref 11.5–15.5)
WBC: 7.1 10*3/uL (ref 4.0–10.5)
nRBC: 0 % (ref 0.0–0.2)

## 2020-01-04 LAB — BASIC METABOLIC PANEL
Anion gap: 9 (ref 5–15)
BUN: 17 mg/dL (ref 8–23)
CO2: 27 mmol/L (ref 22–32)
Calcium: 8 mg/dL — ABNORMAL LOW (ref 8.9–10.3)
Chloride: 88 mmol/L — ABNORMAL LOW (ref 98–111)
Creatinine, Ser: 0.82 mg/dL (ref 0.61–1.24)
GFR calc Af Amer: >60 mL/min (ref >60)
GFR calc non Af Amer: >60 mL/min (ref >60)
Glucose, Bld: 103 mg/dL — ABNORMAL HIGH (ref 70–99)
Potassium: 3.6 mmol/L (ref 3.5–5.1)
Sodium: 124 mmol/L — ABNORMAL LOW (ref 135–145)

## 2020-01-04 LAB — SARS CORONAVIRUS 2 (TAT 6-24 HRS): SARS Coronavirus 2: NEGATIVE

## 2020-01-04 LAB — MAGNESIUM: Magnesium: 2 mg/dL (ref 1.7–2.4)

## 2020-01-04 MED ORDER — DIPHENHYDRAMINE HCL 50 MG/ML IJ SOLN
12.5000 mg | Freq: Once | INTRAMUSCULAR | Status: AC
  Administered 2020-01-04: 13:00:00 12.5 mg via INTRAVENOUS
  Filled 2020-01-04: qty 1

## 2020-01-04 MED ORDER — FAMOTIDINE IN NACL 20-0.9 MG/50ML-% IV SOLN: 20.0000 mg | Freq: Once | INTRAVENOUS | Status: DC

## 2020-01-04 NOTE — Progress Notes (Signed)
PROGRESS NOTE    Travis Austin  [id number] DOB: [birth year] DOA: 01/02/2020 PCP: Birdie Sons, MD    Assessment & Plan:   Active Problems:   Hyponatremia    Travis Austin is a 86 y.o. Caucasian male with medical history significant for HTN and hypothyroidism who presented with elevated BP and recent generalized weakness.     1.  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  Pt felt generally weak.  Hyponatremia seemed to have started back in 2017, though usually in low 130's.  Pt said he was switched to a new diuretic indapamide about a year ago and believed it was the cause of his low Na level. --Urine Na 77, urine osm 635, strongly suggest SIADH (serum osm wasn't collected on admission). --NS 500 ml given and MIVF started on admission.  Na unchanged after that at 116. IV fluids were discontinued. Patient was started on fluid restriction. Sodium significantly improving. Mentation improving as well. PT recommends home with home health discharge. Sodium still 124 at this morning.  We will continue fluid restriction and monitor.  2.  Hypertensive urgency, improved --continue verapamil SR and lisinopril  --d/c indapamide.   --as needed IV labetalol.  3.  Hypokalemia.   Potassium will be replaced and magnesium will be optimized.  4.  Hypothyroidism.   --TSH wnl. --We will continue Synthroid   5.  Reported allergic reaction patient is allergic to meat and he had a piece of meat at the time of lunch. We gave the patient 1 dose of Benadryl and Pepcid proactively.  DVT prophylaxis: Lovenox SQ Code Status: Full code  Family Communication: No family at bedside Disposition Plan: Home tomorrow on 01/05/2020 depending on sodium level.   Subjective and Interval History:  No nausea no vomiting no fever no chills. Eating okay.  Objective: Vitals:   01/04/20 0020 01/04/20 0042 01/04/20 0454 01/04/20 1156  BP: (!)  178/87 114/62 (!) 104/91 (!) 126/58  Pulse: 66  (!) 52 62  Resp: 20  20   Temp: (!) 97.5 F (36.4 C)  98 F (36.7 C) 97.6 F (36.4 C)  TempSrc: Oral  Oral   SpO2: 100%  100% 100%  Weight:      Height:        Intake/Output Summary (Last 24 hours) at 01/04/2020 1847 Last data filed at 01/04/2020 0949 Gross per 24 hour  Intake 360 ml  Output 0 ml  Net 360 ml   Filed Weights   01/02/20 1939  Weight: 70.3 kg    Examination:   Constitutional: NAD, AAOx3 HEENT: conjunctivae and lids normal, EOMI CV: RRR no M,R,G. Distal pulses +2.  No cyanosis.  RESP: CTA B/L, normal respiratory effort, on RA GI: +BS, NTND Extremities: No effusions, edema, or tenderness in BLE SKIN: warm, dry and intact Neuro: II - XII grossly intact.  Sensation intact Psych: Normal mood and affect.  Appropriate judgement and reason   Data Reviewed: I have personally reviewed following labs and imaging studies  CBC: Recent Labs  Lab 01/02/20 1954 01/03/20 0251 01/04/20 0338  WBC 7.2 8.5 7.1  HGB 16.0 15.7 14.1  HCT 42.8 41.7 38.5*  MCV 85.9 86.5 87.9  PLT 255 254 [id number]   Basic Metabolic Panel: Recent Labs  Lab 01/02/20 1954 01/02/20 1954 01/03/20 0251 01/03/20 1403 01/03/20 1748 01/03/20 2122 01/04/20 0338  NA 116*   < > 116* 119* 122* 122* 124*  K 3.1*  --  4.3  --   --   --  3.6  CL 79*  --  83*  --   --   --  88*  CO2 24  --  23  --   --   --  27  GLUCOSE 149*  --  135*  --   --   --  103*  BUN 24*  --  20  --   --   --  17  CREATININE 0.87  --  0.76  --   --   --  0.82  CALCIUM 8.5*  --  8.3*  --   --   --  8.0*  MG 1.9  --   --   --   --   --  2.0  PHOS 2.5  --   --   --   --   --   --    < > = values in this interval not displayed.   GFR: Estimated Creatinine Clearance: 63.7 mL/min (by C-G formula based on SCr of 0.82 mg/dL). Liver Function Tests: No results for input(s): AST, ALT, ALKPHOS, BILITOT, PROT, ALBUMIN in the last 168 hours. No results for input(s): LIPASE, AMYLASE  in the last 168 hours. No results for input(s): AMMONIA in the last 168 hours. Coagulation Profile: No results for input(s): INR, PROTIME in the last 168 hours. Cardiac Enzymes: No results for input(s): CKTOTAL, CKMB, CKMBINDEX, TROPONINI in the last 168 hours. BNP (last 3 results) No results for input(s): PROBNP in the last 8760 hours. HbA1C: No results for input(s): HGBA1C in the last 72 hours. CBG: No results for input(s): GLUCAP in the last 168 hours. Lipid Profile: No results for input(s): CHOL, HDL, LDLCALC, TRIG, CHOLHDL, LDLDIRECT in the last 72 hours. Thyroid Function Tests: Recent Labs    01/02/20 1954  TSH 2.657   Anemia Panel: No results for input(s): VITAMINB12, FOLATE, FERRITIN, TIBC, IRON, RETICCTPCT in the last 72 hours. Sepsis Labs: No results for input(s): PROCALCITON, LATICACIDVEN in the last 168 hours.  Recent Results (from the past 240 hour(s))  Novel Coronavirus, NAA ([facility])     Status: None   Collection Time: 12/31/19  1:00 PM   Specimen: Nasopharyngeal(NP) swabs in vial transport medium   NASOPHARYNGE  TESTING  Result Value Ref Range Status   SARS-CoV-2, NAA Not Detected Not Detected Final    Comment: This nucleic acid amplification test was developed and its performance characteristics determined by [employer]. Nucleic acid amplification tests include PCR and TMA. This test has not been FDA cleared or approved. This test has been authorized by FDA under an Emergency Use Authorization (EUA). This test is only authorized for the duration of time the declaration that circumstances exist justifying the authorization of the emergency use of in vitro diagnostic tests for detection of SARS-CoV-2 virus and/or diagnosis of COVID-19 infection under section 564(b)(1) of the Act, 21 U.S.C. [id number]) (1), unless the authorization is terminated or revoked sooner. When diagnostic testing is negative, the possibility of a false negative result should  be considered in the context of a patient's recent exposures and the presence of clinical signs and symptoms consistent with COVID-19. An individual without symptoms of COVID-19 and who is not shedding SARS-CoV-2 virus would  expect to have a negative (not detected) result in this assay.   SARS CORONAVIRUS 2 (TAT 6-24 HRS) Nasopharyngeal Nasopharyngeal Swab     Status: None   Collection Time: 01/03/20  5:45 PM   Specimen: Nasopharyngeal Swab  Result  Value Ref Range Status   SARS Coronavirus 2 NEGATIVE NEGATIVE Final    Comment: (NOTE) SARS-CoV-2 target nucleic acids are NOT DETECTED. The SARS-CoV-2 RNA is generally detectable in upper and lower respiratory specimens during the acute phase of infection. Negative results do not preclude SARS-CoV-2 infection, do not rule out co-infections with other pathogens, and should not be used as the sole basis for treatment or other patient management decisions. Negative results must be combined with clinical observations, patient history, and epidemiological information. The expected result is Negative. Fact Sheet for Patients: SugarRoll.be Fact Sheet for Healthcare Providers: https://www.woods-mathews.com/ This test is not yet approved or cleared by the [place] FDA and  has been authorized for detection and/or diagnosis of SARS-CoV-2 by FDA under an Emergency Use Authorization (EUA). This EUA will remain  in effect (meaning this test can be used) for the duration of the COVID-19 declaration under Section 56 4(b)(1) of the Act, 21 U.S.C. section 360bbb-3(b)(1), unless the authorization is terminated or revoked sooner. Performed at [facility], [place] [address], [place], [place] [phone]       Radiology Studies: DG Chest Portable 1 View  Result Date: 01/02/2020 CLINICAL DATA:  86 year old male with weakness. EXAM: PORTABLE CHEST 1 VIEW COMPARISON:  None. FINDINGS: No focal consolidation,  pleural effusion, pneumothorax. The cardiac silhouette is within normal limits. The aorta is mildly tortuous. No acute osseous pathology. IMPRESSION: No active disease. Electronically Signed   By: Anner Crete M.D.   On: 01/02/2020 22:06     Scheduled Meds: . aspirin EC  81 mg Oral Daily  . enoxaparin (LOVENOX) injection  40 mg Subcutaneous Q24H  . levothyroxine  25 mcg Oral QAC breakfast  . lisinopril  20 mg Oral Daily  . multivitamin with minerals  1 tablet Oral Daily  . verapamil  120 mg Oral Daily   Continuous Infusions: . famotidine (PEPCID) IV Stopped (01/04/20 1557)     LOS: 2 days     Berle Mull, MD [facility] If 7PM-7AM, please contact night-coverage 01/04/2020, 6:47 PM

## 2020-01-04 NOTE — Evaluation (Signed)
Physical Therapy Evaluation Patient Details Name: Travis Austin MRN: [id number] DOB: 1931-07-19 Today's Date: 01/04/2020   History of Present Illness  86 yo male who presented to ED with acute onset HTN and recent generalized weakness. PMH includes skin cancer, heart murmur in childhood, HTN  Clinical Impression  Pt is an 86 yo male admitted for above. Pt received sitting EOB and agreeable to PT. Pt reports living at home on the main level with his granddaughter living with him and staying upstairs. Pt reports being independent with ADLs and IADLs. Pt states that in the home he ambulates without AD and outside he uses bil trekking poles. Pt very HOH and unsure of baseline cognition so difficult to fully assess PLOF and home set up. Pt independent with STS from bed and recliner without use of AD. Ambulating within the room without AD pt frequently reaching our for furniture to improve stability. When provided with RW for ambulation in hall pt with much improved stability and gait pattern. Suspect pt has improved stability with his bilateral trekking poles that he uses for outside ambulation. Pt encouraged to ambulate with RW or trekking poles all the time in order to provide support for BUE to improve stability and safety. Pt presents with marked increased sway in tandem stance without UE support however stable with feet in narrow BOS. Pt presents with decreased strength, balance, and activity tolerance limiting functional mobility. Pt would benefit from acute PT to improve noted deficits and further assess dynamic balance. Recommendation for home health PT to further improve deficits and balance and decrease fall risk. Also recommend supervision for most of the day especially during mobility to increase safety and decrease fall risk.     Follow Up Recommendations Home health PT;Supervision - Intermittent    Equipment Recommendations  None recommended by PT    Recommendations for Other Services        Precautions / Restrictions Precautions Precautions: Fall Restrictions Weight Bearing Restrictions: No      Mobility  Bed Mobility Overal bed mobility: Independent             General bed mobility comments: pt received sitting EOB and ended session in recliner  Transfers Overall transfer level: Independent Equipment used: None             General transfer comment: pt independent with STS from bed and recliner without AD, overall steady with transfer however once standing wants to reach out for furniture surfing  Ambulation/Gait Ambulation/Gait assistance: Supervision;Min guard Gait Distance (Feet): 180 Feet Assistive device: Rolling walker (2 wheeled);None Gait Pattern/deviations: Step-through pattern;Decreased stride length Gait velocity: mildly decreased   General Gait Details: pt ambulated lap around nurses station with RW, pt able to ambulate within room without AD however uses furniture and bed rails for balance, when provided RW much improved stability during ambulation, no overt LOB with RW  Stairs            Wheelchair Mobility    Modified Rankin (Stroke Patients Only)       Balance Overall balance assessment: Mild deficits observed, not formally tested;Needs assistance Sitting-balance support: Feet supported;No upper extremity supported Sitting balance-Leahy Scale: Good       Standing balance-Leahy Scale: Fair Standing balance comment: able to maintain static standing without AD and ambulate however much improved stability with BUE support during ambulation               High Level Balance Comments: able to maintain static balance in narrow  BOS, increased sway noted in tandem stance without UE support (pt not understanding task so unable to get accurate timing suspect could hold ~10 sec)             Pertinent Vitals/Pain Pain Assessment: No/denies pain    Home Living Family/patient expects to be discharged to:: Private  residence Living Arrangements: Other relatives(granddaughter and her dog and cat) Available Help at Discharge: Family(unsure of other family able to assist, brother calls frequently) Type of Home: House Home Access: Stairs to enter   [employer] of Steps: steps in the front with handrail unsure what side and exact number, in back 2 steps without handrail Home Layout: Multi-level;Able to live on main level with bedroom/bathroom Home Equipment: Walker - 4 wheels;Shower seat(trekking poles) Additional Comments: pt reports using bilateral trekking poles for outside ambulation, no AD within home    Prior Function Level of Independence: Independent with assistive device(s)         Comments: pt reports being independent with all ADLs including cooking meals and light housework, pt reports having fallen in the past unsure how many and how recently     Hand Dominance        Extremity/Trunk Assessment   Upper Extremity Assessment Upper Extremity Assessment: Generalized weakness    Lower Extremity Assessment Lower Extremity Assessment: Generalized weakness    Cervical / Trunk Assessment Cervical / Trunk Assessment: Normal  Communication   Communication: HOH  Cognition Arousal/Alertness: Awake/alert Behavior During Therapy: WFL for tasks assessed/performed Overall Cognitive Status: No family/caregiver present to determine baseline cognitive functioning                                 General Comments: alert and oriented, extremely HOH so difficult to fully assess cognition, able to follow simple commands      General Comments General comments (skin integrity, edema, etc.): VSS throughout    Exercises     Assessment/Plan    PT Assessment Patient needs continued PT services  PT Problem List Decreased strength;Decreased mobility;Decreased safety awareness;Decreased range of motion;Decreased activity tolerance;Decreased balance;Decreased knowledge of  use of DME       PT Treatment Interventions DME instruction;Therapeutic exercise;Gait training;Balance training;Stair training;Neuromuscular re-education;Functional mobility training;Therapeutic activities;Patient/family education;Cognitive remediation    PT Goals (Current goals can be found in the Care Plan section)  Acute Rehab PT Goals Patient Stated Goal: improve balance PT Goal Formulation: With patient Time For Goal Achievement: 01/18/20 Potential to Achieve Goals: Good    Frequency Min 2X/week   Barriers to discharge Decreased caregiver support      Co-evaluation               AM-PAC PT "6 Clicks" Mobility  Outcome Measure Help needed turning from your back to your side while in a flat bed without using bedrails?: None Help needed moving from lying on your back to sitting on the side of a flat bed without using bedrails?: None Help needed moving to and from a bed to a chair (including a wheelchair)?: None Help needed standing up from a chair using your arms (e.g., wheelchair or bedside chair)?: None Help needed to walk in hospital room?: A Little Help needed climbing 3-5 steps with a railing? : A Little 6 Click Score: 22    End of Session Equipment Utilized During Treatment: Gait belt Activity Tolerance: Patient tolerated treatment well Patient left: in chair;with call bell/phone within reach;with chair alarm set  Nurse Communication: Mobility status PT Visit Diagnosis: Unsteadiness on feet (R26.81);Muscle weakness (generalized) (M62.81);History of falling (Z91.81)    Time: [id number] PT Time Calculation (min) (ACUTE ONLY): 20 min   Charges:   PT Evaluation $PT Eval Moderate Complexity: 1 Mod          Johnesha Acheampong PT, DPT 1:07 PM,01/04/20 [phone]  Keegan Ducey Drucilla Chalet 01/04/2020, 1:07 PM

## 2020-01-05 ENCOUNTER — Ambulatory Visit: Payer: Medicare Other | Admitting: Family Medicine

## 2020-01-05 LAB — CBC
HCT: 40.7 % (ref 39.0–52.0)
Hemoglobin: 14.8 g/dL (ref 13.0–17.0)
MCH: 31.9 pg (ref 26.0–34.0)
MCHC: 36.4 g/dL — ABNORMAL HIGH (ref 30.0–36.0)
MCV: 87.7 fL (ref 80.0–100.0)
Platelets: 248 10*3/uL (ref 150–400)
RBC: 4.64 MIL/uL (ref 4.22–5.81)
RDW: 11.5 % (ref 11.5–15.5)
WBC: 7.1 10*3/uL (ref 4.0–10.5)
nRBC: 0 % (ref 0.0–0.2)

## 2020-01-05 LAB — BASIC METABOLIC PANEL
Anion gap: 11 (ref 5–15)
BUN: 26 mg/dL — ABNORMAL HIGH (ref 8–23)
CO2: 28 mmol/L (ref 22–32)
Calcium: 8.5 mg/dL — ABNORMAL LOW (ref 8.9–10.3)
Chloride: 88 mmol/L — ABNORMAL LOW (ref 98–111)
Creatinine, Ser: 0.98 mg/dL (ref 0.61–1.24)
GFR calc Af Amer: >60 mL/min (ref >60)
GFR calc non Af Amer: >60 mL/min (ref >60)
Glucose, Bld: 98 mg/dL (ref 70–99)
Potassium: 3.5 mmol/L (ref 3.5–5.1)
Sodium: 127 mmol/L — ABNORMAL LOW (ref 135–145)

## 2020-01-05 LAB — MAGNESIUM: Magnesium: 2 mg/dL (ref 1.7–2.4)

## 2020-01-05 NOTE — TOC Transition Note (Signed)
Transition of Care [facility]) - CM/SW Discharge Note   Patient Details  Name: Travis Austin MRN: [id number] Date of Birth: Oct 04, 1931  [facility] CM/SW Contact:  Beverly Sessions, RN Phone Number: 01/05/2020, 4:34 PM   Clinical Narrative:    Patient admitted from home granddaughter.   Daughter at bedside to transport home today  PT has assessed patient and recommends home health PT.  Patient in agreement.  Daughter states she do not have a preference of home health agency.  Referral made and accepted by Corene Cornea from [facility]    Final next level of care: Concrete Barriers to Discharge: No Barriers Identified   Patient Goals and CMS Choice   CMS Medicare.gov Compare Post Acute Care list provided to:: Patient Represenative (must comment) Choice offered to / list presented to : Adult Children  Discharge Placement                       Discharge Plan and Services     Post Acute Care Choice: Home Health                    HH Arranged: PT [place] Agency: [place] ([place]) Date [place]: 01/05/20   Representative spoke with at [place]: [place] ([place]) Interventions     Readmission Risk Interventions No flowsheet data found.

## 2020-01-05 NOTE — Progress Notes (Signed)
Pt discharged per MD order. Discharge instructions explained to pt and his daughter. Both verbalized understanding with all questions answered to their satisfaction. Pt taken to car in wheelchair by staff.

## 2020-01-05 NOTE — Discharge Instructions (Signed)
Hyponatremia Hyponatremia is when the amount of salt (sodium) in your blood is too low. When sodium levels are low, your cells absorb extra water, which causes them to swell. The swelling happens throughout the body, but it mostly affects the brain. What are the causes? This condition may be caused by:  Certain medical conditions, such as: ? Heart, kidney, or liver problems. ? Thyroid problems. ? Adrenal gland problems. ? Metabolic conditions, such as Addison disease or syndrome of inappropriate antidiuretic hormone (SIADH).  Severe vomiting or diarrhea.  Certain medicines or illegal drugs.  Dehydration.  Drinking too much water.  Eating a diet that is low in sodium.  Large burns on your body.  Excessive sweating. What increases the risk? You are more likely to develop this condition if you:  Have long-term (chronic) kidney disease.  Have heart failure.  Have a medical condition that causes frequent or excessive diarrhea.  Participate in intense physical activities, such as marathon running.  Take certain medicines that affect the sodium and fluid balance in the blood. Some of these medicine types include: ? Diuretics. ? NSAIDs. ? Some opioid pain medicines. ? Some antidepressants. ? Some seizure prevention medicines. What are the signs or symptoms? Symptoms of this condition include:  Headache.  Nausea and vomiting.  Being very tired (lethargic).  Muscle weakness and cramping.  Loss of appetite.  Feeling weak or light-headed. Severe symptoms of this condition include:  Confusion.  Agitation.  Having a rapid heart rate.  Passing out (fainting).  Seizures.  Coma. How is this diagnosed? This condition is diagnosed based on:  A physical exam.  Your medical history.  Tests, including: ? Blood tests. ? Urine tests. How is this treated? Treatment for this condition depends on the cause. Treatment may include:  Getting fluids through an IV  that is inserted into one of your veins.  Medicines to correct the sodium imbalance. If medicines are causing the condition, the medicines will need to be adjusted.  Limiting your water or fluid intake to get the correct sodium balance.  Monitoring in the hospital setting to closely watch your symptoms for improvement. Follow these instructions at home:   Take over-the-counter and prescription medicines only as told by your health care provider. Many medicines can make this condition worse. Talk with your health care provider about any medicines that you are currently taking.  Carefully follow a recommended diet as told by your health care provider.  Carefully follow instructions from your health care provider about fluid restrictions.  Do not drink alcohol.  Keep all follow-up visits as told by your health care provider. This is important. Contact a health care provider if:  You develop worsening nausea, fatigue, headache, confusion, or weakness.  Your symptoms go away and then return.  You have problems following the recommended diet. Get help right away if:  You have a seizure.  You pass out.  You have ongoing diarrhea or vomiting. Summary  Hyponatremia is when the amount of salt (sodium) in your blood is too low.  When sodium levels are low, your cells absorb extra water, which causes them to swell.  The swelling happens throughout the body, but it mostly affects the brain.  Treatment for this condition depends on the cause. It may include IV fluids, medicines, and limiting your fluid intake. This information is not intended to replace advice given to you by your health care provider. Make sure you discuss any questions you have with your health care provider. Document   Revised: 10/17/2018 Document Reviewed: 10/17/2018 Elsevier Patient Education  2020 Elsevier Inc.  

## 2020-01-05 NOTE — Care Management Important Message (Signed)
Important Message  Patient Details  Name: Travis Austin MRN: [id number] Date of Birth: [birth year]   Medicare Important Message Given:  Yes     Dannette Barbara 01/05/2020, 10:57 AM

## 2020-01-05 NOTE — Discharge Summary (Signed)
[facility] Discharge Summary   Patient: Travis Austin [id number]  PCP: Birdie Sons, MD  Date of admission: 01/02/2020   Date of discharge: 01/05/2020      Discharge Diagnoses:  Principal diagnosis Hyponatremia Active Problems:   Hyponatremia   Admitted From: Home Disposition:  Home   Recommendations for Outpatient Follow-up:  1. PCP: Follow-up with PCP in 1 week with repeat BMP 2. Follow up LABS/TEST: Repeat BMP  Follow-up Information    Birdie Sons, MD. Schedule an appointment as soon as possible for a visit in 1 week(s).   Specialty: Family Medicine Why: repeat BMP Contact information: [address] [place] [address] [phone]          Diet recommendation: Cardiac diet  Activity: The patient is advised to gradually reintroduce usual activities, as tolerated  Discharge Condition: stable  Code Status: Full code   History of present illness: As per the H and P dictated on admission, "Travis Austin  is a 86 y.o. male with a known history of multiple medical problems that will be mentioned below, who presented to the emergency room with a acute onset of elevated blood pressure with recent generalized weakness.  He denied any headache or dizziness or blurred vision or paresthesias or focal muscle weakness.  He admits to constipation denies any abdominal pain or melena or bright red blood per rectum.  No cough or wheezing or dyspnea.  No recent exposure to COVID-19.  He denied any increased recent intake of water.  Upon presentation to the emergency room, blood pressure was 172/70 with otherwise normal vital signs.  Blood pressure has been as high as 188/91.Labs were remarkable for hypokalemia of 3.1 and hyponatremia of 116 with hypochloremia of 79.  TSH was normal at 2.6.  Urine osmolality was 635 and urine sodium 77.  Chest x-ray showed no acute cardiopulmonary disease.  EKG showed sinus rhythm with a rate of 77 with PACs and right  bundle branch block. "  Hospital Course:   Summary of his active problems in the hospital is as following.   1.Symptomatic hyponatremia 2/2 SIADH Acute on chronic hyponatremia  --Na 116 on presentation.  Pt felt generally weak.  Hyponatremia seemed to have started back in 2017, though usually in low 130's.  Pt said he was switched to a new diuretic indapamide about a year ago and believed it was the cause of his low Na level. --Urine Na 77, urine osm 635, strongly suggest SIADH (serum osm wasn't collected on admission). --NS 500 ml given and MIVF started on admission.  Na unchanged after that at 116. IV fluids were discontinued. Patient was started on fluid restriction. Sodium significantly improving. Mentation improving as well. PT recommends home with home health discharge. Sodium gradually improving.  Patient will be discharged home.  Will discontinue diuretics  2. Hypertensiveurgency, improved --continue verapamil SR and lisinopril  --d/c indapamide. --as needed IV labetalol.  3.Hypokalemia. Potassium will be replaced and magnesium will be optimized.  4. Hypothyroidism. --TSH wnl. --We will continue Synthroid   5.  Reported allergic reaction patient is allergic to meat and he had a piece of meat at the time of lunch. We gave the patient 1 dose of Benadryl and Pepcid proactively.  Patient was ambulatory without any assistance. On the day of the discharge the patient's vitals were stable, and no other acute medical condition were reported by patient. the patient was felt safe to be discharge at Home with Home health.  Consultants: none Procedures: none  Discharge Exam: General: Appear in no distress, no Rash; Oral Mucosa Clear, moist. Cardiovascular: S1 and S2 Present, no Murmur, Respiratory: normal respiratory effort, Bilateral Air entry present and no Crackles, no wheezes Abdomen: Bowel Sound present, Soft and no tenderness, no hernia Extremities:  no Pedal edema, no calf tenderness Neurology: alert and oriented to time, place, and person affect appropriate.  Filed Weights   01/02/20 1939  Weight: 70.3 kg   Vitals:   01/05/20 0547 01/05/20 1233  BP: 117/66 133/69  Pulse: 64 (!) 59  Resp: 20 18  Temp: 98.2 F (36.8 C) 98.7 F (37.1 C)  SpO2: 99% 98%    DISCHARGE MEDICATION: Allergies as of 01/05/2020      Reactions   Alpha-gal    (meat allergy from tick bite)      Medication List    TAKE these medications   acetaminophen 325 MG tablet Commonly known as: TYLENOL Take 1,000 mg by mouth every 6 (six) hours as needed.   aspirin EC 81 MG tablet Take 81 mg by mouth daily.   benzonatate 100 MG capsule Commonly known as: Tessalon Perles Take 1 capsule (100 mg total) by mouth 3 (three) times daily as needed.   indapamide 1.25 MG tablet Commonly known as: LOZOL Take 1 tablet (1.25 mg total) by mouth daily.   lisinopril 20 MG tablet Commonly known as: ZESTRIL TAKE 1 TABLET DAILY   multivitamin with minerals Tabs tablet Take 1 tablet daily by mouth.   Synthroid 25 MCG tablet Generic drug: levothyroxine TAKE 1 TABLET DAILY BEFORE BREAKFAST   Testosterone 2 MG/24HR Pt24 Place 1 patch onto the skin daily.   verapamil 120 MG 24 hr capsule Commonly known as: VERELAN PM TAKE 1 CAPSULE DAILY.      Allergies  Allergen Reactions  . Alpha-Gal     (meat allergy from tick bite)   Discharge Instructions    Increase activity slowly   Complete by: As directed       The results of significant diagnostics from this hospitalization (including imaging, microbiology, ancillary and laboratory) are listed below for reference.    Significant Diagnostic Studies: DG Chest Portable 1 View  Result Date: 01/02/2020 CLINICAL DATA:  86 year old male with weakness. EXAM: PORTABLE CHEST 1 VIEW COMPARISON:  None. FINDINGS: No focal consolidation, pleural effusion, pneumothorax. The cardiac silhouette is within normal limits.  The aorta is mildly tortuous. No acute osseous pathology. IMPRESSION: No active disease. Electronically Signed   By: Anner Crete M.D.   On: 01/02/2020 22:06    Microbiology: Recent Results (from the past 240 hour(s))  Novel Coronavirus, NAA ([facility])     Status: None   Collection Time: 12/31/19  1:00 PM   Specimen: Nasopharyngeal(NP) swabs in vial transport medium   NASOPHARYNGE  TESTING  Result Value Ref Range Status   SARS-CoV-2, NAA Not Detected Not Detected Final    Comment: This nucleic acid amplification test was developed and its performance characteristics determined by [employer]. Nucleic acid amplification tests include PCR and TMA. This test has not been FDA cleared or approved. This test has been authorized by FDA under an Emergency Use Authorization (EUA). This test is only authorized for the duration of time the declaration that circumstances exist justifying the authorization of the emergency use of in vitro diagnostic tests for detection of SARS-CoV-2 virus and/or diagnosis of COVID-19 infection under section 564(b)(1) of the Act, 21 U.S.C. [id number]) (1), unless the authorization is terminated or revoked sooner.  When diagnostic testing is negative, the possibility of a false negative result should be considered in the context of a patient's recent exposures and the presence of clinical signs and symptoms consistent with COVID-19. An individual without symptoms of COVID-19 and who is not shedding SARS-CoV-2 virus would  expect to have a negative (not detected) result in this assay.   SARS CORONAVIRUS 2 (TAT 6-24 HRS) Nasopharyngeal Nasopharyngeal Swab     Status: None   Collection Time: 01/03/20  5:45 PM   Specimen: Nasopharyngeal Swab  Result Value Ref Range Status   SARS Coronavirus 2 NEGATIVE NEGATIVE Final    Comment: (NOTE) SARS-CoV-2 target nucleic acids are NOT DETECTED. The SARS-CoV-2 RNA is generally detectable in upper and lower  respiratory specimens during the acute phase of infection. Negative results do not preclude SARS-CoV-2 infection, do not rule out co-infections with other pathogens, and should not be used as the sole basis for treatment or other patient management decisions. Negative results must be combined with clinical observations, patient history, and epidemiological information. The expected result is Negative. Fact Sheet for Patients: SugarRoll.be Fact Sheet for Healthcare Providers: https://www.woods-mathews.com/ This test is not yet approved or cleared by the [place] FDA and  has been authorized for detection and/or diagnosis of SARS-CoV-2 by FDA under an Emergency Use Authorization (EUA). This EUA will remain  in effect (meaning this test can be used) for the duration of the COVID-19 declaration under Section 56 4(b)(1) of the Act, 21 U.S.C. section 360bbb-3(b)(1), unless the authorization is terminated or revoked sooner. Performed at [facility], [place] [address], [place], [place] [address]      Labs: CBC: Recent Labs  Lab 01/02/20 1954 01/03/20 0251 01/04/20 0338 01/05/20 0433  WBC 7.2 8.5 7.1 7.1  HGB 16.0 15.7 14.1 14.8  HCT 42.8 41.7 38.5* 40.7  MCV 85.9 86.5 87.9 87.7  PLT 255 254 212 [id number]   Basic Metabolic Panel: Recent Labs  Lab 01/02/20 1954 01/02/20 1954 01/03/20 0251 01/03/20 0251 01/03/20 1403 01/03/20 1748 01/03/20 2122 01/04/20 0338 01/05/20 0433  NA 116*   < > 116*   < > 119* 122* 122* 124* 127*  K 3.1*  --  4.3  --   --   --   --  3.6 3.5  CL 79*  --  83*  --   --   --   --  88* 88*  CO2 24  --  23  --   --   --   --  27 28  GLUCOSE 149*  --  135*  --   --   --   --  103* 98  BUN 24*  --  20  --   --   --   --  17 26*  CREATININE 0.87  --  0.76  --   --   --   --  0.82 0.98  CALCIUM 8.5*  --  8.3*  --   --   --   --  8.0* 8.5*  MG 1.9  --   --   --   --   --   --  2.0 2.0  PHOS 2.5  --   --   --    --   --   --   --   --    < > = values in this interval not displayed.   Liver Function Tests: No results for input(s): AST, ALT, ALKPHOS, BILITOT, PROT, ALBUMIN in the last 168  hours. No results for input(s): LIPASE, AMYLASE in the last 168 hours. No results for input(s): AMMONIA in the last 168 hours. Cardiac Enzymes: No results for input(s): CKTOTAL, CKMB, CKMBINDEX, TROPONINI in the last 168 hours. BNP (last 3 results) No results for input(s): BNP in the last 8760 hours. CBG: No results for input(s): GLUCAP in the last 168 hours.  Time spent: 35 minutes  Signed:  Berle Mull  [facility] 01/05/2020 11:27 PM

## 2020-01-06 ENCOUNTER — Telehealth: Payer: Self-pay

## 2020-01-06 NOTE — Telephone Encounter (Signed)
I have made the 1st attempt to contact the patient or family member in charge, in order to follow up from recently being discharged from the hospital. I left a message on voicemail requesting a CB. -MM 

## 2020-01-06 NOTE — Telephone Encounter (Signed)
Transition Care Management Follow-up Telephone Call  Date of discharge and from where: [facility] on 01/05/20  How have you been since you were released from the hospital? Doing very well and feels completely normal. B/p has been running normal since d/c. Declines weakness, SOB, pain or n/v/d.  Any questions or concerns? Yes, pt was told to stop Lozol due to causing his sodium to drop. Pt would like to know if he does need to stay off this medication?  Items Reviewed:  Did the pt receive and understand the discharge instructions provided? Yes   Medications obtained and verified? No , pt declined reviewing over the phone.  Any new allergies since your discharge? No   Dietary orders reviewed? Yes  Do you have support at home? Yes   Other (ie: DME, Home Health, etc) N/A  Functional Questionnaire: (I = Independent and D = Dependent)  Bathing/Dressing- I   Meal Prep- I  Eating- I  Maintaining continence- I  Transferring/Ambulation- I  Managing Meds- I   Follow up appointments reviewed:    PCP Hospital f/u appt confirmed? No , there are no available HFU apts available. Message sent to see about an apt.  [facility] f/u appt confirmed? N/A  Are transportation arrangements needed? No   If their condition worsens, is the pt aware to call  their PCP or go to the ED? Yes  Was the patient provided with contact information for the PCP's office or ED? Yes  Was the pt encouraged to call back with questions or concerns? Yes

## 2020-01-06 NOTE — Telephone Encounter (Signed)
No HFU scheduled.  

## 2020-01-06 NOTE — Telephone Encounter (Signed)
It's always ok to use same day slots if there are no hospital follow up slots available. Just try to find 40 minutes if you can. Thanks!

## 2020-01-06 NOTE — Telephone Encounter (Signed)
Spoke with pt and scheduled HFU for 01/11/20 @ 2:40 PM (40 min apt). FYI!

## 2020-01-11 ENCOUNTER — Other Ambulatory Visit: Payer: Self-pay

## 2020-01-11 ENCOUNTER — Ambulatory Visit (INDEPENDENT_AMBULATORY_CARE_PROVIDER_SITE_OTHER): Payer: Medicare Other | Admitting: Family Medicine

## 2020-01-11 ENCOUNTER — Encounter: Payer: Self-pay | Admitting: Family Medicine

## 2020-01-11 VITALS — BP 172/87 | HR 68 | Temp 97.1°F | Resp 16 | Wt 158.0 lb

## 2020-01-11 DIAGNOSIS — E871 Hypo-osmolality and hyponatremia: Secondary | ICD-10-CM | POA: Diagnosis not present

## 2020-01-11 DIAGNOSIS — I10 Essential (primary) hypertension: Secondary | ICD-10-CM | POA: Diagnosis not present

## 2020-01-11 NOTE — Progress Notes (Signed)
Patient: Travis Austin Male    DOB: [birth year]   86 y.o.   MRN: [id number] Visit Date: 01/11/2020  Today's Provider: Lelon Huh, MD   Chief Complaint  Patient presents with  . Hospitalization Follow-up   Subjective:     HPI  Follow up Hospitalization  Patient was admitted to [facility] on 01/03/2020 and discharged on 01/05/2020. He was treated for Hyponatremia with presenting sodium of 116 Treatment for this included: labs, chest x-ray and IV. Telephone follow up was done on 01/06/2020 with McKenzie.Sodium increased to 127 by the time of discharge He reports good compliance with treatment. He reports this condition is Improved. Patient reports his blood pressure this morning was 130/70. Still feels more fatigued than usual.   Presenting symptoms included feeling excessively fatigued and very high blood pressure.   ------------------------------------------------------------------------------------      Allergies  Allergen Reactions  . Alpha-Gal     (meat allergy from tick bite)     Current Outpatient Medications:  .  acetaminophen (TYLENOL) 325 MG tablet, Take 1,000 mg by mouth every 6 (six) hours as needed., Disp: , Rfl:  .  aspirin EC 81 MG tablet, Take 81 mg by mouth daily. , Disp: , Rfl:  .  lisinopril (PRINIVIL,ZESTRIL) 20 MG tablet, TAKE 1 TABLET DAILY, Disp: 90 tablet, Rfl: 3 .  Multiple Vitamin (MULTIVITAMIN WITH MINERALS) TABS tablet, Take 1 tablet daily by mouth., Disp: , Rfl:  .  SYNTHROID 25 MCG tablet, TAKE 1 TABLET DAILY BEFORE BREAKFAST, Disp: 90 tablet, Rfl: 3 .  verapamil (VERELAN PM) 120 MG 24 hr capsule, TAKE 1 CAPSULE DAILY., Disp: 90 capsule, Rfl: 3 .  benzonatate (TESSALON PERLES) 100 MG capsule, Take 1 capsule (100 mg total) by mouth 3 (three) times daily as needed. (Patient not taking: Reported on 01/11/2020), Disp: 20 capsule, Rfl: 0 .  indapamide (LOZOL) 1.25 MG tablet, Take 1 tablet (1.25 mg total) by mouth daily. (Patient not taking:  Reported on 01/11/2020), Disp: 90 tablet, Rfl: 4  Review of Systems  Constitutional: Positive for fatigue. Negative for appetite change, chills and fever.  Respiratory: Negative for chest tightness, shortness of breath and wheezing.   Cardiovascular: Negative for chest pain and palpitations.  Gastrointestinal: Negative for abdominal pain, nausea and vomiting.    Social History   Tobacco Use  . Smoking status: Never Smoker  . Smokeless tobacco: Never Used  Substance Use Topics  . Alcohol use: Yes    Alcohol/week: 0.0 standard drinks    Comment: 1 drink a day, any kind      Objective:   BP (!) 172/87 (BP Location: Right Arm, Cuff Size: Normal)   Pulse 68   Temp (!) 97.1 F (36.2 C) (Temporal)   Resp 16   Wt 158 lb (71.7 kg)   SpO2 99% Comment: room air  BMI 23.67 kg/m  Vitals:   01/11/20 1428 01/11/20 1433  BP: (!) 188/82 (!) 172/87  Pulse: 68   Resp: 16   Temp: (!) 97.1 F (36.2 C)   TempSrc: Temporal   SpO2: 99%   Weight: 158 lb (71.7 kg)   Body mass index is 23.67 kg/m.   Physical Exam   General Appearance:    Well developed, well nourished male in no acute distress  Eyes:    PERRL, conjunctiva/corneas clear, EOM's intact       Lungs:     Clear to auscultation bilaterally, respirations unlabored  Heart:    Normal heart rate.  Normal rhythm. No murmurs, rubs, or gallops.   MS:   All extremities are intact.   Neurologic:   Awake, alert, oriented x 3. No apparent focal neurological           defect.        No results found for any visits on 01/11/20.     Assessment & Plan    1. Hyponatremia No off of indepamide - Basic metabolic panel  2. Essential hypertension He reports home blood pressures mostly below 140/90. Continue verapamil and lisinopril for now.   The entirety of the information documented in the History of Present Illness, Review of Systems and Physical Exam were personally obtained by me. Portions of this information were initially  documented by [facility] CMA,and reviewed by me for thoroughness and accuracy.      Lelon Huh, MD  [facility]

## 2020-01-12 ENCOUNTER — Encounter: Payer: Self-pay | Admitting: Family Medicine

## 2020-01-12 ENCOUNTER — Telehealth: Payer: Self-pay

## 2020-01-12 LAB — BASIC METABOLIC PANEL
BUN/Creatinine Ratio: 24 (ref 10–24)
BUN: 22 mg/dL (ref 8–27)
CO2: 22 mmol/L (ref 20–29)
Calcium: 8.7 mg/dL (ref 8.6–10.2)
Chloride: 94 mmol/L — ABNORMAL LOW (ref 96–106)
Creatinine, Ser: 0.91 mg/dL (ref 0.76–1.27)
GFR calc Af Amer: 88 mL/min/{1.73_m2} (ref >59)
GFR calc non Af Amer: 76 mL/min/{1.73_m2} (ref >59)
Glucose: 99 mg/dL (ref 65–99)
Potassium: 4.6 mmol/L (ref 3.5–5.2)
Sodium: 131 mmol/L — ABNORMAL LOW (ref 134–144)

## 2020-01-12 NOTE — Telephone Encounter (Signed)
Patient advised as below.  

## 2020-01-12 NOTE — Telephone Encounter (Signed)
-----   Message from Birdie Sons, MD sent at 01/12/2020  8:05 AM EST ----- Sodium level is slightly low, but much better, up to 131. Continue current medications. Schedule follow up in 3 months to check BP, call if home bp stays consistently high.

## 2020-01-31 ENCOUNTER — Ambulatory Visit: Payer: Medicare Other | Attending: Internal Medicine

## 2020-01-31 DIAGNOSIS — Z23 Encounter for immunization: Secondary | ICD-10-CM | POA: Insufficient documentation

## 2020-01-31 NOTE — Progress Notes (Signed)
   Covid-19 Vaccination Clinic  Name:  Travis Austin    MRN: [id number] DOB: 08-Sep-1931  01/31/2020  [name] was observed post Covid-19 immunization for 15 minutes without incidence. He was provided with Vaccine Information Sheet and instruction to access the V-Safe system.   [name] was instructed to call 911 with any severe reactions post vaccine: Marland Kitchen Difficulty breathing  . Swelling of your face and throat  . A fast heartbeat  . A bad rash all over your body  . Dizziness and weakness    Immunizations Administered    Name Date Dose VIS Date Route   Pfizer COVID-19 Vaccine 01/31/2020  9:21 AM 0.3 mL 11/27/2019 Intramuscular   Manufacturer: [place]   Lot: [id number]   [place]: [id number]

## 2020-02-18 ENCOUNTER — Telehealth: Payer: Self-pay

## 2020-02-18 NOTE — Telephone Encounter (Signed)
Copied from [place] [id number]. Topic: General - Inquiry >> Feb 18, 2020  8:45 AM Virl Axe D wrote: Reason for CRM: Pt called to inquire about the cost of each shingles vaccine. He stated PCP told him he should expect to pay $250 for both and pt stated he was billed $270 for each dose totaling $540. He would like to know what the correct amount for the shingles vaccines are.

## 2020-02-20 ENCOUNTER — Other Ambulatory Visit: Payer: Self-pay | Admitting: Family Medicine

## 2020-02-20 NOTE — Telephone Encounter (Signed)
Requested Prescriptions  Pending Prescriptions Disp Refills  . lisinopril (ZESTRIL) 20 MG tablet [Pharmacy Med Name: LISINOPRIL TAB 20MG ] 90 tablet 3    Sig: TAKE 1 TABLET DAILY     Cardiovascular:  ACE Inhibitors Failed - 02/20/2020  8:13 AM      Failed - Last BP in normal range    BP Readings from Last 1 Encounters:  01/11/20 (!) 172/87         Passed - Cr in normal range and within 180 days    Creatinine, Ser  Date Value Ref Range Status  01/11/2020 0.91 0.76 - 1.27 mg/dL Final         Passed - K in normal range and within 180 days    Potassium  Date Value Ref Range Status  01/11/2020 4.6 3.5 - 5.2 mmol/L Final         Passed - Patient is not pregnant      Passed - Valid encounter within last 6 months    Recent Outpatient Visits          1 month ago Hyponatremia   [facility] Birdie Sons, MD   12 months ago Need for shingles vaccine   [facility] Birdie Sons, MD   1 year ago Low testosterone in male   [place], Kirstie Peri, MD   1 year ago Essential hypertension   [facility] Birdie Sons, MD   1 year ago White coat syndrome with hypertension   [facility] [place], Kirstie Peri, MD             . SYNTHROID 25 MCG tablet [Pharmacy Med Name: SYNTHROID TAB 0.025MG ] 90 tablet 3    Sig: TAKE 1 TABLET DAILY BEFORE BREAKFAST     Endocrinology:  Hypothyroid Agents Failed - 02/20/2020  8:13 AM      Failed - TSH needs to be rechecked within 3 months after an abnormal result. Refill until TSH is due.      Passed - TSH in normal range and within 360 days    TSH  Date Value Ref Range Status  01/02/2020 2.657 0.350 - 4.500 uIU/mL Final    Comment:    Performed by a 3rd Generation assay with a functional sensitivity of <=0.01 uIU/mL. Performed at [facility], [address] [place], [place] [address]   12/26/2018 1.670 0.450 - 4.500 uIU/mL Final         Passed - Valid  encounter within last 12 months    Recent Outpatient Visits          1 month ago Hyponatremia   [facility] Birdie Sons, MD   12 months ago Need for shingles vaccine   [facility] Birdie Sons, MD   1 year ago Low testosterone in male   [place], Kirstie Peri, MD   1 year ago Essential hypertension   [facility] Birdie Sons, MD   1 year ago White coat syndrome with hypertension   [facility] Birdie Sons, MD

## 2020-02-23 ENCOUNTER — Ambulatory Visit: Payer: Medicare Other | Attending: Internal Medicine

## 2020-02-23 DIAGNOSIS — Z23 Encounter for immunization: Secondary | ICD-10-CM

## 2020-02-23 NOTE — Progress Notes (Signed)
   Covid-19 Vaccination Clinic  Name:  Travis Austin    MRN: [id number] DOB: Aug 11, 1931  02/23/2020  [name] was observed post Covid-19 immunization for 15 minutes without incident. He was provided with Vaccine Information Sheet and instruction to access the V-Safe system.   [name] was instructed to call 911 with any severe reactions post vaccine: Marland Kitchen Difficulty breathing  . Swelling of face and throat  . A fast heartbeat  . A bad rash all over body  . Dizziness and weakness   Immunizations Administered    Name Date Dose VIS Date Route   Pfizer COVID-19 Vaccine 02/23/2020  3:55 PM 0.3 mL 11/27/2019 Intramuscular   Manufacturer: [place]   Lot: [id number]   [place]: [id number]

## 2020-02-23 NOTE — Progress Notes (Signed)
Subjective:   Travis Austin is a 86 y.o. male who presents for Medicare Annual/Subsequent preventive examination.    This visit is being conducted through telemedicine due to the COVID-19 pandemic. This patient has given me verbal consent via doximity to conduct this visit, patient states they are participating from their home address. Some vital signs may be absent or patient reported.    Patient identification: identified by name, DOB, and current address  Review of Systems:  N/A  Cardiac Risk Factors include: advanced age (>20men, >45 women);hypertension;male gender     Objective:    Vitals: There were no vitals taken for this visit.  There is no height or weight on file to calculate BMI. Unable to obtain vitals due to visit being conducted via telephonically.   Advanced Directives 02/24/2020 01/04/2020 08/06/2018 10/31/2017 08/13/2017 01/23/2017  Does Patient Have a Medical Advance Directive? No No No No No Yes  Type of Advance Directive - - - - - Living will;Healthcare Power of [place] in Chart? - - - - - No - copy requested  Would patient like information on creating a medical advance directive? No - Patient declined No - Patient declined No - Patient declined No - Patient declined No - Patient declined -    Tobacco Social History   Tobacco Use  Smoking Status Never Smoker  Smokeless Tobacco Never Used     Counseling given: Not Answered   Clinical Intake:  Pre-visit preparation completed: Yes  Pain : No/denies pain Pain Score: 0-No pain     Nutritional Risks: None Diabetes: No  How often do you need to have someone help you when you read instructions, pamphlets, or other written materials from your doctor or pharmacy?: 1 - Never  Interpreter Needed?: No  Information entered by :: [facility], LPN  Past Medical History:  Diagnosis Date  . Cancer ([place])    SKIN  . Hearing aid worn    bilateral  . Heart murmur     CHILDHOOD  . History of adenomatous polyp of colon   . Hypertension   . Hyponatremia    Past Surgical History:  Procedure Laterality Date  . CATARACT EXTRACTION W/PHACO Left 08/13/2017   Procedure: CATARACT EXTRACTION PHACO AND INTRAOCULAR LENS PLACEMENT ([place]) LEFT;  Surgeon: Eulogio Bear, MD;  Location: [place];  Service: Ophthalmology;  Laterality: Left;  . CATARACT EXTRACTION W/PHACO Right 10/31/2017   Procedure: CATARACT EXTRACTION PHACO AND INTRAOCULAR LENS PLACEMENT ([facility]);  Surgeon: Eulogio Bear, MD;  Location: [facility];  Service: Ophthalmology;  Laterality: Right;  Lot: [id number] [place]: 00:58.3 AP%: 14.0 CDE: 8.11  . COLONOSCOPY     Family History  Problem Relation Age of Onset  . Arthritis Mother   . Parkinson's disease Mother   . Heart disease Father   . Hypertension Father   . Cancer Brother        prostate   Social History   Socioeconomic History  . Marital status: Widowed    Spouse name: Not on file  . Number of children: 1  . Years of education: Not on file  . Highest education level: Professional school degree (e.g., MD, DDS, DVM, JD)  Occupational History  . Occupation: Retired  Tobacco Use  . Smoking status: Never Smoker  . Smokeless tobacco: Never Used  Substance and Sexual Activity  . Alcohol use: Yes    Alcohol/week: 7.0 standard drinks    Types: 7 Shots of liquor per  week    Comment: 1 drink a day  . Drug use: No  . Sexual activity: Not on file  Other Topics Concern  . Not on file  Social History Narrative  . Not on file   Social Determinants of Health   Financial Resource Strain: Low Risk   . Difficulty of Paying Living Expenses: Not hard at all  Food Insecurity: No Food Insecurity  . Worried About Charity fundraiser in the Last Year: Never true  . Ran Out of Food in the Last Year: Never true  Transportation Needs: No Transportation Needs  . Lack of Transportation (Medical): No  . Lack of Transportation (Non-Medical):  No  Physical Activity: Insufficiently Active  . Days of Exercise per Week: 3 days  . Minutes of Exercise per Session: 40 min  Stress: No Stress Concern Present  . Feeling of Stress : Not at all  Social Connections: Moderately Isolated  . Frequency of Communication with Friends and Family: More than three times a week  . Frequency of Social Gatherings with Friends and Family: More than three times a week  . Attends Religious Services: Never  . Active Member of Clubs or Organizations: No  . Attends Archivist Meetings: Never  . Marital Status: Widowed    Outpatient Encounter Medications as of 02/24/2020  Medication Sig  . acetaminophen (TYLENOL) 325 MG tablet Take 1,000 mg by mouth every 6 (six) hours as needed.  Marland Kitchen aspirin EC 81 MG tablet Take 81 mg by mouth daily.   Marland Kitchen lisinopril (ZESTRIL) 20 MG tablet TAKE 1 TABLET DAILY  . Multiple Vitamin (MULTIVITAMIN WITH MINERALS) TABS tablet Take 1 tablet daily by mouth.  . SYNTHROID 25 MCG tablet TAKE 1 TABLET DAILY BEFORE BREAKFAST  . verapamil (VERELAN PM) 120 MG 24 hr capsule TAKE 1 CAPSULE DAILY.   No facility-administered encounter medications on file as of 02/24/2020.    Activities of Daily Living In your present state of health, do you have any difficulty performing the following activities: 02/24/2020 01/04/2020  Hearing? Tempie Donning  Comment Wears bilateral hearing aids. -  Vision? N N  Difficulty concentrating or making decisions? N N  Walking or climbing stairs? N N  Dressing or bathing? N N  Doing errands, shopping? N N  Preparing Food and eating ? N -  Using the Toilet? N -  In the past six months, have you accidently leaked urine? N -  Do you have problems with loss of bowel control? N -  Managing your Medications? N -  Managing your Finances? N -  Housekeeping or managing your Housekeeping? N -  Some recent data might be hidden    Patient Care Team: Birdie Sons, MD as PCP - General (Family Medicine) Eulogio Bear, MD as Consulting Physician (Ophthalmology) Ralene Bathe, MD as Consulting Physician (Dermatology)   Assessment:   This is a routine wellness examination for Lelon.  Exercise Activities and Dietary recommendations Current Exercise Habits: Home exercise routine, Type of exercise: walking(rides a stationar bike), Time (Minutes): 45, Frequency (Times/Week): 3, Weekly Exercise (Minutes/Week): 135, Intensity: Mild, Exercise limited by: None identified  Goals    . Increase water intake     Starting 01/23/17, I will drink 3-4 glasses of water a day.       Fall Risk: Fall Risk  02/24/2020 11/11/2019 08/06/2018 12/24/2017 01/23/2017  Falls in the past year? 0 1 Yes Yes Yes  Comment - Emmi Telephone Survey: data to providers  prior to load - - -  Number falls in past yr: 1 1 1 1 2  or more  Comment - Emmi Telephone Survey Actual Response = 1 - - -  Injury with Fall? 0 0 No No No  Risk for fall due to : Impaired mobility;Impaired balance/gait - - - -  Follow up Falls prevention discussed - Falls prevention discussed Falls evaluation completed;Falls prevention discussed Falls prevention discussed    FALL RISK PREVENTION PERTAINING TO THE HOME:  Any stairs in or around the home? Yes  If so, are there any without handrails? No   Home free of loose throw rugs in walkways, pet beds, electrical cords, etc? Yes  Adequate lighting in your home to reduce risk of falls? Yes   ASSISTIVE DEVICES UTILIZED TO PREVENT FALLS:  Life alert? No  Use of a cane, walker or w/c? Yes  Grab bars in the bathroom? Yes  Shower chair or bench in shower? No  Elevated toilet seat or a handicapped toilet? No   TIMED UP AND GO:  Was the test performed? No .    Depression Screen PHQ 2/9 Scores 02/24/2020 08/06/2018 02/11/2018 01/23/2017  PHQ - 2 Score 0 0 0 0    Cognitive Function: Declined today.        Immunization History  Administered Date(s) Administered  . Influenza, High Dose Seasonal PF  08/21/2017, 09/11/2018  . Influenza-Unspecified 09/17/2015, 09/11/2018  . PFIZER SARS-COV-2 Vaccination 01/31/2020, 02/23/2020  . Pneumococcal Conjugate-13 01/21/2015  . Pneumococcal Polysaccharide-23 07/02/2001  . Td 05/29/2004  . Tdap 08/24/2011  . Zoster 11/24/2008  . Zoster Recombinat (Shingrix) 12/26/2018, 02/25/2019    Qualifies for Shingles Vaccine? Completed series  Tdap: Up to date  Flu Vaccine: Up to date  Pneumococcal Vaccine: Completed series  Screening Tests Health Maintenance  Topic Date Due  . COLONOSCOPY  04/07/2017  . TETANUS/TDAP  08/23/2021  . INFLUENZA VACCINE  Completed  . PNA vac Low Risk Adult  Completed   Cancer Screenings:  Colorectal Screening: Completed 04/07/12. Repeat every 5 years. Pt plans to f/u with Gastrologist.   Lung Cancer Screening: (Low Dose CT Chest recommended if Age 79-80 years, 30 pack-year currently smoking OR have quit w/in 15years.) does not qualify.   Additional Screening:  Vision Screening: Recommended annual ophthalmology exams for early detection of glaucoma and other disorders of the eye.  Dental Screening: Recommended annual dental exams for proper oral hygiene  Community Resource Referral:  CRR required this visit?  No        Plan:  I have personally reviewed and addressed the Medicare Annual Wellness questionnaire and have noted the following in the patient's chart:  A. Medical and social history B. Use of alcohol, tobacco or illicit drugs  C. Current medications and supplements D. Functional ability and status E.  Nutritional status F.  Physical activity G. Advance directives H. List of other physicians I.  Hospitalizations, surgeries, and ER visits in previous 12 months J.  [place] such as hearing and vision if needed, cognitive and depression L. Referrals and appointments   In addition, I have reviewed and discussed with patient certain preventive protocols, quality metrics, and best  practice recommendations. A written personalized care plan for preventive services as well as general preventive health recommendations were provided to patient.   [place]ora Score, [place]  [id number] Nurse Health Advisor   Nurse Notes: Pt to follow up with GI regarding repeat colonoscopy.

## 2020-02-24 ENCOUNTER — Ambulatory Visit (INDEPENDENT_AMBULATORY_CARE_PROVIDER_SITE_OTHER): Payer: Medicare Other

## 2020-02-24 ENCOUNTER — Other Ambulatory Visit: Payer: Self-pay

## 2020-02-24 DIAGNOSIS — Z0000 Encounter for general adult medical examination without abnormal findings: Secondary | ICD-10-CM

## 2020-02-24 NOTE — Patient Instructions (Signed)
Travis Austin , Thank you for taking time to come for your Medicare Wellness Visit. I appreciate your ongoing commitment to your health goals. Please review the following plan we discussed and let me know if I can assist you in the future.   Screening recommendations/referrals: Colonoscopy: Currently due- pt to follow up with GI regarding repeat colonoscopy. Recommended yearly ophthalmology/optometry visit for glaucoma screening and checkup Recommended yearly dental visit for hygiene and checkup  Vaccinations: Influenza vaccine: Up to date Pneumococcal vaccine: Completed series Tdap vaccine: Up to date Shingles vaccine: Completed series    Advanced directives: Advance directive discussed with you today. Even though you declined this today please call our office should you change your mind and we can give you the proper paperwork for you to fill out.  Conditions/risks identified: Recommend to increase water intake to 6-8 8 oz glasses a day.   Next appointment: 02/29/20 @ 2:00 PM with Dr Caryn Section   Preventive Care 84 Years and Older, Male Preventive care refers to lifestyle choices and visits with your health care provider that can promote health and wellness. What does preventive care include?  A yearly physical exam. This is also called an annual well check.  Dental exams once or twice a year.  Routine eye exams. Ask your health care provider how often you should have your eyes checked.  Personal lifestyle choices, including:  Daily care of your teeth and gums.  Regular physical activity.  Eating a healthy diet.  Avoiding tobacco and drug use.  Limiting alcohol use.  Practicing safe sex.  Taking low doses of aspirin every day.  Taking vitamin and mineral supplements as recommended by your health care provider. What happens during an annual well check? The services and screenings done by your health care provider during your annual well check will depend on your age, overall  health, lifestyle risk factors, and family history of disease. Counseling  Your health care provider may ask you questions about your:  Alcohol use.  Tobacco use.  Drug use.  Emotional well-being.  Home and relationship well-being.  Sexual activity.  Eating habits.  History of falls.  Memory and ability to understand (cognition).  Work and work Statistician. Screening  You may have the following tests or measurements:  Height, weight, and BMI.  Blood pressure.  Lipid and cholesterol levels. These may be checked every 5 years, or more frequently if you are over 56 years old.  Skin check.  Lung cancer screening. You may have this screening every year starting at age 20 if you have a 30-pack-year history of smoking and currently smoke or have quit within the past 15 years.  Fecal occult blood test (FOBT) of the stool. You may have this test every year starting at age 45.  Flexible sigmoidoscopy or colonoscopy. You may have a sigmoidoscopy every 5 years or a colonoscopy every 10 years starting at age 58.  Prostate cancer screening. Recommendations will vary depending on your family history and other risks.  Hepatitis C blood test.  Hepatitis B blood test.  Sexually transmitted disease (STD) testing.  Diabetes screening. This is done by checking your blood sugar (glucose) after you have not eaten for a while (fasting). You may have this done every 1-3 years.  Abdominal aortic aneurysm (AAA) screening. You may need this if you are a current or former smoker.  Osteoporosis. You may be screened starting at age 53 if you are at high risk. Talk with your health care provider about your  test results, treatment options, and if necessary, the need for more tests. Vaccines  Your health care provider may recommend certain vaccines, such as:  Influenza vaccine. This is recommended every year.  Tetanus, diphtheria, and acellular pertussis (Tdap, Td) vaccine. You may need a Td  booster every 10 years.  Zoster vaccine. You may need this after age 59.  Pneumococcal 13-valent conjugate (PCV13) vaccine. One dose is recommended after age 35.  Pneumococcal polysaccharide (PPSV23) vaccine. One dose is recommended after age 59. Talk to your health care provider about which screenings and vaccines you need and how often you need them. This information is not intended to replace advice given to you by your health care provider. Make sure you discuss any questions you have with your health care provider. Document Released: 12/30/2015 Document Revised: 08/22/2016 Document Reviewed: 10/04/2015 Elsevier Interactive Patient Education  2017 [employer] Prevention in the Home Falls can cause injuries. They can happen to people of all ages. There are many things you can do to make your home safe and to help prevent falls. What can I do on the outside of my home?  Regularly fix the edges of walkways and driveways and fix any cracks.  Remove anything that might make you trip as you walk through a door, such as a raised step or threshold.  Trim any bushes or trees on the path to your home.  Use bright outdoor lighting.  Clear any walking paths of anything that might make someone trip, such as rocks or tools.  Regularly check to see if handrails are loose or broken. Make sure that both sides of any steps have handrails.  Any raised decks and porches should have guardrails on the edges.  Have any leaves, snow, or ice cleared regularly.  Use sand or salt on walking paths during winter.  Clean up any spills in your garage right away. This includes oil or grease spills. What can I do in the bathroom?  Use night lights.  Install grab bars by the toilet and in the tub and shower. Do not use towel bars as grab bars.  Use non-skid mats or decals in the tub or shower.  If you need to sit down in the shower, use a plastic, non-slip stool.  Keep the floor dry. Clean up  any water that spills on the floor as soon as it happens.  Remove soap buildup in the tub or shower regularly.  Attach bath mats securely with double-sided non-slip rug tape.  Do not have throw rugs and other things on the floor that can make you trip. What can I do in the bedroom?  Use night lights.  Make sure that you have a light by your bed that is easy to reach.  Do not use any sheets or blankets that are too big for your bed. They should not hang down onto the floor.  Have a firm chair that has side arms. You can use this for support while you get dressed.  Do not have throw rugs and other things on the floor that can make you trip. What can I do in the kitchen?  Clean up any spills right away.  Avoid walking on wet floors.  Keep items that you use a lot in easy-to-reach places.  If you need to reach something above you, use a strong step stool that has a grab bar.  Keep electrical cords out of the way.  Do not use floor polish or wax that  makes floors slippery. If you must use wax, use non-skid floor wax.  Do not have throw rugs and other things on the floor that can make you trip. What can I do with my stairs?  Do not leave any items on the stairs.  Make sure that there are handrails on both sides of the stairs and use them. Fix handrails that are broken or loose. Make sure that handrails are as long as the stairways.  Check any carpeting to make sure that it is firmly attached to the stairs. Fix any carpet that is loose or worn.  Avoid having throw rugs at the top or bottom of the stairs. If you do have throw rugs, attach them to the floor with carpet tape.  Make sure that you have a light switch at the top of the stairs and the bottom of the stairs. If you do not have them, ask someone to add them for you. What else can I do to help prevent falls?  Wear shoes that:  Do not have high heels.  Have rubber bottoms.  Are comfortable and fit you well.  Are  closed at the toe. Do not wear sandals.  If you use a stepladder:  Make sure that it is fully opened. Do not climb a closed stepladder.  Make sure that both sides of the stepladder are locked into place.  Ask someone to hold it for you, if possible.  Clearly mark and make sure that you can see:  Any grab bars or handrails.  First and last steps.  Where the edge of each step is.  Use tools that help you move around (mobility aids) if they are needed. These include:  Canes.  Walkers.  Scooters.  Crutches.  Turn on the lights when you go into a dark area. Replace any light bulbs as soon as they burn out.  Set up your furniture so you have a clear path. Avoid moving your furniture around.  If any of your floors are uneven, fix them.  If there are any pets around you, be aware of where they are.  Review your medicines with your doctor. Some medicines can make you feel dizzy. This can increase your chance of falling. Ask your doctor what other things that you can do to help prevent falls. This information is not intended to replace advice given to you by your health care provider. Make sure you discuss any questions you have with your health care provider. Document Released: 09/29/2009 Document Revised: 05/10/2016 Document Reviewed: 01/07/2015 Elsevier Interactive Patient Education  2017 [employer].

## 2020-02-29 ENCOUNTER — Ambulatory Visit (INDEPENDENT_AMBULATORY_CARE_PROVIDER_SITE_OTHER): Payer: Medicare Other | Admitting: Family Medicine

## 2020-02-29 ENCOUNTER — Other Ambulatory Visit: Payer: Self-pay

## 2020-02-29 ENCOUNTER — Encounter: Payer: Self-pay | Admitting: Family Medicine

## 2020-02-29 VITALS — BP 170/80 | HR 62 | Temp 97.7°F | Resp 16 | Wt 162.0 lb

## 2020-02-29 DIAGNOSIS — E871 Hypo-osmolality and hyponatremia: Secondary | ICD-10-CM

## 2020-02-29 NOTE — Progress Notes (Signed)
Patient: Travis Austin   DOB: [birth year]   86 y.o. Male  MRN: [id number] Visit Date: 02/29/2020  Today's Provider: Lelon Huh, MD  Subjective:    Chief Complaint  Patient presents with  . Hypertension  . Hypothyroidism   Had AWV with [facility] on 02/24/2020  HPI  Hypertension, follow-up:  BP Readings from Last 3 Encounters:  02/29/20 (!) 170/80  01/11/20 (!) 172/87  01/05/20 133/69    He was last seen for hypertension 7 weeks ago.  BP at that visit was 172/87. Management since that visit includes checking labs which showed sodium levels were slightly low. Patient was advised to continue current medications and follow up in 3 months. He reports good compliance with treatment. He is not having side effects.  He is exercising. He is adherent to low salt diet.   Outside blood pressures are 124/65. He is experiencing none.  Patient denies chest pain, chest pressure/discomfort, claudication, dyspnea, exertional chest pressure/discomfort, fatigue, irregular heart beat, lower extremity edema, near-syncope, orthopnea, palpitations, paroxysmal nocturnal dyspnea, syncope and tachypnea.   Cardiovascular risk factors include advanced age (older than 68 for men, 37 for women), hypertension and male gender.  Use of agents associated with hypertension: NSAIDS and thyroid hormones.     Weight trend: fluctuating a bit Wt Readings from Last 3 Encounters:  02/29/20 162 lb (73.5 kg)  01/11/20 158 lb (71.7 kg)  01/02/20 155 lb (70.3 kg)    Current diet: well balanced  ------------------------------------------------------------------------   Follow up for Hypothyroidism:  The patient was last seen for this 1 years ago. Changes made at last visit include none. Last TSH was checked 01/02/2020 and the result was 2.657.  He reports good compliance with treatment. He feels that condition is Unchanged. He is not having side effects.    ------------------------------------------------------------------------------------     Medications: Medication Sig  . acetaminophen (TYLENOL) 325 MG tablet Take 1,000 mg by mouth every 6 (six) hours as needed.  Marland Kitchen aspirin EC 81 MG tablet Take 81 mg by mouth daily.   Marland Kitchen lisinopril (ZESTRIL) 20 MG tablet TAKE 1 TABLET DAILY  . Multiple Vitamin (MULTIVITAMIN WITH MINERALS) TABS tablet Take 1 tablet daily by mouth.  . SYNTHROID 25 MCG tablet TAKE 1 TABLET DAILY BEFORE BREAKFAST  . verapamil (VERELAN PM) 120 MG 24 hr capsule TAKE 1 CAPSULE DAILY.   No facility-administered medications prior to visit.    Lab Results  Component Value Date   NA 131 (L) 01/11/2020   K 4.6 01/11/2020   CL 94 (L) 01/11/2020   CO2 22 01/11/2020   GLUCOSE 99 01/11/2020   BUN 22 01/11/2020   CREATININE 0.91 01/11/2020   CALCIUM 8.7 01/11/2020   GFRNONAA 76 01/11/2020   GFRAA 88 01/11/2020    Review of Systems  Constitutional: Negative for appetite change, chills and fever.  Respiratory: Negative for chest tightness, shortness of breath and wheezing.   Cardiovascular: Negative for chest pain and palpitations.  Gastrointestinal: Positive for constipation. Negative for abdominal pain, nausea and vomiting.  Musculoskeletal: Positive for gait problem.      Objective:    BP (!) 170/80 (BP Location: Right Arm, Cuff Size: Normal)   Pulse 62   Temp 97.7 F (36.5 C) (Temporal)   Resp 16   Wt 162 lb (73.5 kg)   SpO2 98% Comment: room air  BMI 24.27 kg/m    Physical Exam    General: Appearance:    Well developed, well nourished  male in no acute distress  Eyes:    PERRL, conjunctiva/corneas clear, EOM's intact       Lungs:     Clear to auscultation bilaterally, respirations unlabored  Heart:    Normal heart rate. Normal rhythm. No murmurs, rubs, or gallops.   MS:   All extremities are intact.   Neurologic:   Awake, alert, oriented x 3. No apparent focal neurological           defect.        No  results found for any visits on 02/29/20.    Assessment & Plan:    1. Hyponatremia Now off of all diuretics.  - Renal function panel    The entirety of the information documented in the History of Present Illness, Review of Systems and Physical Exam were personally obtained by me. Portions of this information were initially documented by Meyer Cory, CMA and reviewed by me for thoroughness and accuracy.    Lelon Huh, MD  [facility] [phone] (phone) [phone] (fax)  [place]

## 2020-03-01 LAB — RENAL FUNCTION PANEL
Albumin: 4.3 g/dL (ref 3.6–4.6)
BUN/Creatinine Ratio: 23 (ref 10–24)
BUN: 21 mg/dL (ref 8–27)
CO2: 21 mmol/L (ref 20–29)
Calcium: 9.1 mg/dL (ref 8.6–10.2)
Chloride: 94 mmol/L — ABNORMAL LOW (ref 96–106)
Creatinine, Ser: 0.91 mg/dL (ref 0.76–1.27)
GFR calc Af Amer: 88 mL/min/{1.73_m2} (ref >59)
GFR calc non Af Amer: 76 mL/min/{1.73_m2} (ref >59)
Glucose: 94 mg/dL (ref 65–99)
Phosphorus: 3 mg/dL (ref 2.8–4.1)
Potassium: 4.6 mmol/L (ref 3.5–5.2)
Sodium: 130 mmol/L — ABNORMAL LOW (ref 134–144)

## 2020-05-19 DIAGNOSIS — H40003 Preglaucoma, unspecified, bilateral: Secondary | ICD-10-CM | POA: Diagnosis not present

## 2020-08-05 DIAGNOSIS — Z23 Encounter for immunization: Secondary | ICD-10-CM | POA: Diagnosis not present

## 2020-08-10 ENCOUNTER — Other Ambulatory Visit: Payer: Self-pay | Admitting: Family Medicine

## 2020-08-10 NOTE — Telephone Encounter (Signed)
Requested Prescriptions  Pending Prescriptions Disp Refills  . verapamil (VERELAN PM) 120 MG 24 hr capsule [Pharmacy Med Name: VERAPAMIL SR CAP 120MG 24H] 90 capsule 0    Sig: TAKE 1 CAPSULE DAILY.     Cardiovascular:  Calcium Channel Blockers Failed - 08/10/2020  2:22 AM      Failed - Last BP in normal range    BP Readings from Last 1 Encounters:  02/29/20 (!) 170/80         Failed - Valid encounter within last 6 months    Recent Outpatient Visits          5 months ago Hyponatremia   [facility] Birdie Sons, MD   7 months ago Hyponatremia   [facility] Birdie Sons, MD   1 year ago Need for shingles vaccine   [facility] Birdie Sons, MD   1 year ago Low testosterone in male   [place], Kirstie Peri, MD   1 year ago Essential hypertension   [facility] Birdie Sons, MD      Future Appointments            In 1 month Fisher, Kirstie Peri, MD [facility], [facility]           Patient has an upcoming appt in one month.

## 2020-09-02 ENCOUNTER — Ambulatory Visit: Payer: Medicare Other | Admitting: Family Medicine

## 2020-09-13 ENCOUNTER — Encounter: Payer: Self-pay | Admitting: Family Medicine

## 2020-09-17 DIAGNOSIS — Z23 Encounter for immunization: Secondary | ICD-10-CM | POA: Diagnosis not present

## 2020-09-27 ENCOUNTER — Encounter: Payer: Self-pay | Admitting: Family Medicine

## 2020-09-27 ENCOUNTER — Other Ambulatory Visit: Payer: Self-pay

## 2020-09-27 ENCOUNTER — Ambulatory Visit (INDEPENDENT_AMBULATORY_CARE_PROVIDER_SITE_OTHER): Payer: Medicare Other | Admitting: Family Medicine

## 2020-09-27 VITALS — BP 190/73 | HR 60 | Temp 97.6°F | Ht 68.5 in | Wt 162.2 lb

## 2020-09-27 DIAGNOSIS — Z86010 Personal history of colonic polyps: Secondary | ICD-10-CM | POA: Diagnosis not present

## 2020-09-27 DIAGNOSIS — I10 Essential (primary) hypertension: Secondary | ICD-10-CM

## 2020-09-27 DIAGNOSIS — E871 Hypo-osmolality and hyponatremia: Secondary | ICD-10-CM | POA: Diagnosis not present

## 2020-09-27 NOTE — Progress Notes (Signed)
Established patient visit   Patient: Travis Austin   DOB: 09/05/1931   86 y.o. Male  MRN: [id number] Visit Date: 09/27/2020  Today's healthcare provider: Lelon Huh, MD   Chief Complaint  Patient presents with  . Hypertension  . Hypothyroidism   Subjective    HPI  Hypertension, follow-up  BP Readings from Last 3 Encounters:  09/27/20 (!) 190/73  02/29/20 (!) 170/80  01/11/20 (!) 172/87   Wt Readings from Last 3 Encounters:  09/27/20 162 lb 3.2 oz (73.6 kg)  02/29/20 162 lb (73.5 kg)  01/11/20 158 lb (71.7 kg)     He was last seen for hypertension 7 months ago.  BP at that visit was 170/80. Management since that visit includes no change.  He reports good compliance with treatment. He is not having side effects.  He is following a Regular diet. He is not exercising. He does not smoke.  Use of agents associated with hypertension: thyroid hormones.   Outside blood pressures are being checked at home. He has automatic brachial cuff and wrist cuff, and he reports he consistently gets readings in the 120s/70s at home. He has always had some issues with white coat hypertension.  Symptoms: No chest pain No chest pressure  No palpitations No syncope  No dyspnea No orthopnea  No paroxysmal nocturnal dyspnea No lower extremity edema   Pertinent labs: Lab Results  Component Value Date   CHOL 139 12/26/2018   HDL 47 12/26/2018   LDLCALC 75 12/26/2018   TRIG 84 12/26/2018   CHOLHDL 3.0 12/26/2018   Lab Results  Component Value Date   NA 130 (L) 02/29/2020   K 4.6 02/29/2020   CREATININE 0.91 02/29/2020   GFRNONAA 76 02/29/2020   GFRAA 88 02/29/2020   GLUCOSE 94 02/29/2020     The ASCVD Risk score (Goff DC Jr., et al., 2013) failed to calculate for the following reasons:   The 2013 ASCVD risk score is only valid for ages 28 to 20   --------------------------------------------------------------------------------------------------- Hypothyroid,  follow-up  Lab Results  Component Value Date   TSH 2.657 01/02/2020   TSH 1.670 12/26/2018   TSH 2.640 10/14/2018   Wt Readings from Last 3 Encounters:  09/27/20 162 lb 3.2 oz (73.6 kg)  02/29/20 162 lb (73.5 kg)  01/11/20 158 lb (71.7 kg)    He was last seen for hypothyroid 7 months ago.  Management since that visit includes no change. He reports good compliance with treatment. He is not having side effects.   Symptoms: No change in energy level No constipation  No diarrhea No heat / cold intolerance  No nervousness No palpitations  No weight changes    -----------------------------------------------------------------------------------------      Medications: Outpatient Medications Prior to Visit  Medication Sig  . acetaminophen (TYLENOL) 325 MG tablet Take 1,000 mg by mouth every 6 (six) hours as needed.  Marland Kitchen aspirin EC 81 MG tablet Take 81 mg by mouth daily.   Marland Kitchen lisinopril (ZESTRIL) 20 MG tablet TAKE 1 TABLET DAILY  . Multiple Vitamin (MULTIVITAMIN WITH MINERALS) TABS tablet Take 1 tablet daily by mouth.  . SYNTHROID 25 MCG tablet TAKE 1 TABLET DAILY BEFORE BREAKFAST  . verapamil (VERELAN PM) 120 MG 24 hr capsule TAKE 1 CAPSULE DAILY.   No facility-administered medications prior to visit.    Review of Systems  Constitutional: Negative.   Respiratory: Negative.   Cardiovascular: Negative.   Endocrine: Negative.   Musculoskeletal: Negative.  Objective    BP (!) 190/73 (BP Location: Right Arm, Patient Position: Sitting, Cuff Size: Normal)   Pulse 60   Temp 97.6 F (36.4 C) (Oral)   Ht 5' 8.5" (1.74 m)   Wt 162 lb 3.2 oz (73.6 kg)   SpO2 100%   BMI 24.30 kg/m   Physical Exam   General: Appearance:    Well developed, well nourished male in no acute distress  Eyes:    PERRL, conjunctiva/corneas clear, EOM's intact       Lungs:     Clear to auscultation bilaterally, respirations unlabored  Heart:    Normal heart rate. Normal rhythm. No murmurs, rubs,  or gallops.   MS:   All extremities are intact.   Neurologic:   Awake, alert, oriented x 3. No apparent focal neurological           defect.         No results found for any visits on 09/27/20.  Assessment & Plan     1. Hyponatremia Is no longer on diuretic, last sodium was 130.  - Renal function panel  2. White coat syndrome with hypertension He reports home blood pressures consistently in the 120's/70's and he feels very well. Will continue current medication for now.   3. History of adenomatous polyp of colon Discussed risk/benefit of continue colon polyp surveillance at his age. Dr. Candace Cruise recommend follow up colonoscopy be done in 2018, but advised patient the recommendation have changed somewhat since then. Considering his age, continued surveillance may not be beneficial. Advised I would be willing to refer to GI if he favors continuing with colonoscopies.       The entirety of the information documented in the History of Present Illness, Review of Systems and Physical Exam were personally obtained by me. Portions of this information were initially documented by the CMA and reviewed by me for thoroughness and accuracy.      Lelon Huh, MD  [facility] [phone] (phone) [phone] (fax)  [place]

## 2020-09-28 ENCOUNTER — Telehealth: Payer: Self-pay

## 2020-09-28 LAB — RENAL FUNCTION PANEL
Albumin: 4.4 g/dL (ref 3.6–4.6)
BUN/Creatinine Ratio: 23 (ref 10–24)
BUN: 21 mg/dL (ref 8–27)
CO2: 23 mmol/L (ref 20–29)
Calcium: 9.3 mg/dL (ref 8.6–10.2)
Chloride: 95 mmol/L — ABNORMAL LOW (ref 96–106)
Creatinine, Ser: 0.91 mg/dL (ref 0.76–1.27)
GFR calc Af Amer: 87 mL/min/{1.73_m2} (ref >59)
GFR calc non Af Amer: 76 mL/min/{1.73_m2} (ref >59)
Glucose: 101 mg/dL — ABNORMAL HIGH (ref 65–99)
Phosphorus: 3.8 mg/dL (ref 2.8–4.1)
Potassium: 4.6 mmol/L (ref 3.5–5.2)
Sodium: 132 mmol/L — ABNORMAL LOW (ref 134–144)

## 2020-09-28 NOTE — Telephone Encounter (Signed)
Attempted to contact patient, no answer left a voicemail. Okay for PEC to advise patient.  

## 2020-09-28 NOTE — Telephone Encounter (Signed)
-----   Message from Birdie Sons, MD sent at 09/28/2020 11:13 AM EDT ----- Sodium level is stable at 132. Continue current medications.   Checked with GI about colon polyp. His last polyp was in 2013 and was very small, and low risk for colon cancer. They usually stop doing colonoscopies after the age of 18 since polyps grow very slowly and don't become cancerous for 15-20 years.  Dr. Alice Reichert has taken Dr. Myrna Blazer place at [facility]. We can refer him to Dr. Alice Reichert if he likes, but I don't think it's worthwhile to go through another colonoscopy.

## 2020-09-29 NOTE — Telephone Encounter (Signed)
Left VM to return call for lab results.  

## 2020-10-04 NOTE — Telephone Encounter (Signed)
Tried calling patient. Left message to call back. Letter mailed to patient home since this was the 3rd unsuccessful attempt to reaching patient by phone call.

## 2020-11-09 ENCOUNTER — Other Ambulatory Visit: Payer: Self-pay | Admitting: Family Medicine

## 2020-11-09 MED ORDER — VERAPAMIL HCL ER 120 MG PO CP24: 120.0000 mg | ORAL_CAPSULE | Freq: Every day | ORAL | 3 refills | Status: DC

## 2020-11-09 NOTE — Telephone Encounter (Signed)
[facility] faxed refill request for the following medications:  verapamil (VERELAN PM) 120 MG 24 hr capsule    Please advise. Thanks, [employer]

## 2020-11-09 NOTE — Telephone Encounter (Signed)
Please review. Thanks!  

## 2020-12-21 ENCOUNTER — Ambulatory Visit: Payer: Self-pay | Admitting: *Deleted

## 2020-12-21 NOTE — Telephone Encounter (Signed)
Grand daughter reported the patient fell last night, hit left side of head on the table after feet got tangled as he was turning around. 1/2 cut, bleeding stopped, edges coming together. Patient alert and oriented today with no complaints. After the fall caller says voice was slurred- normal today.No gait concerns today. Appointment made via DT for tomorrow at 2:40p. Reviewed symptoms;vomiting, confusion/unusual sleepiness seek immediate evaluation at UC/ED. Answer Assessment - Initial Assessment Questions 1. MECHANISM: "How did the fall happen?"was turning around and his foot caught caught up.     2. DOMESTIC VIOLENCE AND ELDER ABUSE SCREENING: "Did you fall because someone pushed you or tried to hurt you?" If Yes, ask: "Are you safe now?"     no 3. ONSET: "When did the fall happen?" (e.g., minutes, hours, or days ago)    Before dinner last night 4. LOCATION: "What part of the body hit the ground?" (e.g., back, buttocks, head, hips, knees, hands, head, stomach)     Left side of head 5. INJURY: "Did you hurt (injure) yourself when you fell?" If Yes, ask: "What did you injure? Tell me more about this?" (e.g., body area; type of injury; pain severity)"     Left side of top of head .Ithere any pain?" If Yes, ask: "How bad is the pain?" (e.g., Scale 1-10; or mild,  moderate, severe)   - NONE (0): no pain   - MILD (1-3): doesn't interfere with normal activities    - MODERATE (4-7): interferes with normal activities or awakens from sleep    - SEVERE (8-10): excruciating pain, unable to do any normal activities      Denies any pain 7. SIZE: For cuts, bruises, or swelling, ask: "How large is it?" (e.g., inches or centimeters)     About half inch. 8. PREGNANCY: "Is there any chance you are pregnant?" "When was your last menstrual period?"     na 9. OTHER SYMPTOMS: "Do you have any other symptoms?" (e.g., dizziness, fever, weakness; new onset or worsening).     No complaints at all. 10. CAUSE: "What do  you think caused the fall (or falling)?" (e.g., tripped, dizzy spell)  Protocols used: FALLS AND FALLING-A-AH

## 2020-12-22 ENCOUNTER — Other Ambulatory Visit: Payer: Self-pay

## 2020-12-22 ENCOUNTER — Ambulatory Visit (INDEPENDENT_AMBULATORY_CARE_PROVIDER_SITE_OTHER): Payer: Medicare Other | Admitting: Family Medicine

## 2020-12-22 ENCOUNTER — Encounter: Payer: Self-pay | Admitting: Family Medicine

## 2020-12-22 VITALS — BP 221/86 | HR 88 | Temp 98.4°F | Wt 164.0 lb

## 2020-12-22 DIAGNOSIS — S0101XA Laceration without foreign body of scalp, initial encounter: Secondary | ICD-10-CM

## 2020-12-22 DIAGNOSIS — I10 Essential (primary) hypertension: Secondary | ICD-10-CM | POA: Diagnosis not present

## 2020-12-22 MED ORDER — LOSARTAN POTASSIUM 50 MG PO TABS: 50.0000 mg | ORAL_TABLET | Freq: Every day | ORAL | 3 refills | Status: DC

## 2020-12-22 NOTE — Progress Notes (Signed)
Acute Office Visit  Subjective:    Patient ID: Travis Austin, male    DOB: 10-03-1931, 87 y.o.   MRN: [id number]  No chief complaint on file.   HPI Patient is in today for evaluation after having a fall on 12/20/20.  Patient's granddaughter is with him today and states that their concern is that his head bled for a long time and they were not able to clean it very well.  He denies any associated symptoms and reports he feels well. He states he is just worried that hitting his head may have caused something to happen inside his head.  He was again asked of headache, visual disturbances, dizziness, GI symptoms and again denied having any. He states that his fall was a result of him not picking up his feet.  He says he tends to shuffle.  Past Medical History:  Diagnosis Date   Cancer [facility])    SKIN   Hearing aid worn    bilateral   Heart murmur    CHILDHOOD   History of adenomatous polyp of colon    Hypertension    Hyponatremia     Past Surgical History:  Procedure Laterality Date   CATARACT EXTRACTION W/PHACO Left 08/13/2017   Procedure: CATARACT EXTRACTION PHACO AND INTRAOCULAR LENS PLACEMENT ([place]) LEFT;  Surgeon: Eulogio Bear, MD;  Location: [place];  Service: Ophthalmology;  Laterality: Left;   CATARACT EXTRACTION W/PHACO Right 10/31/2017   Procedure: CATARACT EXTRACTION PHACO AND INTRAOCULAR LENS PLACEMENT (IOC);  Surgeon: Eulogio Bear, MD;  Location: [facility];  Service: Ophthalmology;  Laterality: Right;  Lot: [id number] J [place]: 00:58.3 AP%: 14.0 CDE: 8.11   COLONOSCOPY      Family History  Problem Relation Age of Onset   Arthritis Mother    Parkinson's disease Mother    Heart disease Father    Hypertension Father    Cancer Brother        prostate    Social History   Socioeconomic History   Marital status: Widowed    Spouse name: Not on file   Number of children: 1   Years of education: Not on file   Highest education level: Professional  school degree (e.g., MD, DDS, DVM, JD)  Occupational History   Occupation: Retired  Tobacco Use   Smoking status: Never Smoker   Smokeless tobacco: Never Used  Scientific laboratory technician Use: Never used  Substance and Sexual Activity   Alcohol use: Yes    Alcohol/week: 7.0 standard drinks    Types: 7 Shots of liquor per week    Comment: 1 drink a day   Drug use: No   Sexual activity: Not on file  Other Topics Concern   Not on file  Social History Narrative   Not on file   Social Determinants of Health   Financial Resource Strain: Low Risk    Difficulty of Paying Living Expenses: Not hard at all  Food Insecurity: No Food Insecurity   Worried About Charity fundraiser in the Last Year: Never true   [place] in the Last Year: Never true  Transportation Needs: No Transportation Needs   Lack of Transportation (Medical): No   Lack of Transportation (Non-Medical): No  Physical Activity: Insufficiently Active   Days of Exercise per Week: 3 days   Minutes of Exercise per Session: 40 min  Stress: No Stress Concern Present   Feeling of Stress : Not at all  Social Connections: Socially  Isolated   Frequency of Communication with Friends and Family: More than three times a week   Frequency of Social Gatherings with Friends and Family: More than three times a week   Attends Religious Services: Never   Database administrator or Organizations: No   Attends Banker Meetings: Never   Marital Status: Widowed  Catering manager Violence: Not At Risk   Fear of Current or Ex-Partner: No   Emotionally Abused: No   Physically Abused: No   Sexually Abused: No    Outpatient Medications Prior to Visit  Medication Sig Dispense Refill   acetaminophen (TYLENOL) 325 MG tablet Take 1,000 mg by mouth every 6 (six) hours as needed.     aspirin EC 81 MG tablet Take 81 mg by mouth daily.      lisinopril (ZESTRIL) 20 MG tablet TAKE 1 TABLET DAILY 90 tablet 3   Multiple Vitamin  (MULTIVITAMIN WITH MINERALS) TABS tablet Take 1 tablet daily by mouth.     SYNTHROID 25 MCG tablet TAKE 1 TABLET DAILY BEFORE BREAKFAST 90 tablet 3   verapamil (VERELAN PM) 120 MG 24 hr capsule Take 1 capsule (120 mg total) by mouth daily. 90 capsule 3   No facility-administered medications prior to visit.    Allergies  Allergen Reactions   Indapamide     Very low sodium =116 12/2019   Alpha-Gal     (meat allergy from tick bite)    Review of Systems  Constitutional: Negative.   HENT:  Positive for hearing loss.        Uses bilateral hearing aids.  Eyes:  Negative for visual disturbance.  Respiratory: Negative.    Cardiovascular: Negative.   Gastrointestinal:  Negative for nausea and vomiting.  Neurological:  Negative for dizziness, syncope and headaches.      Objective:    Physical Exam Constitutional:      General: He is not in acute distress.    Appearance: He is well-developed.  HENT:     Head: Normocephalic and atraumatic.     Right Ear: Hearing normal.     Left Ear: Hearing and tympanic membrane normal.     Nose: Nose normal.  Eyes:     General: Lids are normal. No scleral icterus.       Right eye: No discharge.        Left eye: No discharge.     Extraocular Movements: Extraocular movements intact.     Conjunctiva/sclera: Conjunctivae normal.     Pupils: Pupils are equal, round, and reactive to light.  Cardiovascular:     Rate and Rhythm: Normal rate and regular rhythm.     Heart sounds: Normal heart sounds.  Pulmonary:     Effort: Pulmonary effort is normal. No respiratory distress.     Breath sounds: Normal breath sounds.  Abdominal:     General: Bowel sounds are normal.     Palpations: Abdomen is soft.  Musculoskeletal:        General: Normal range of motion.     Cervical back: Normal range of motion and neck supple.  Skin:    General: Skin is intact.     Findings: No rash.     Comments: Large 3 cm abrasion to left parietal scalp. No hematoma or  lymphadenopathy.  Neurological:     Mental Status: He is alert and oriented to person, place, and time.  Psychiatric:        Mood and Affect: Mood and affect normal.  Speech: Speech normal.        Behavior: Behavior normal.        Thought Content: Thought content normal.    BP (!) 221/86 (BP Location: Right Arm, Patient Position: Sitting, Cuff Size: Normal)   Pulse 88   Temp 98.4 F (36.9 C) (Oral)   Wt 164 lb (74.4 kg)   SpO2 99%   BMI 24.57 kg/m  Wt Readings from Last 3 Encounters:  09/27/20 162 lb 3.2 oz (73.6 kg)  02/29/20 162 lb (73.5 kg)  01/11/20 158 lb (71.7 kg)    Health Maintenance Due  Topic Date Due   COLONOSCOPY (Pts 45-53yrs Insurance coverage will need to be confirmed)  04/07/2017   COVID-19 Vaccine (3 - Booster for [place] series) 08/25/2020    There are no preventive care reminders to display for this patient.   Lab Results  Component Value Date   TSH 2.657 01/02/2020   Lab Results  Component Value Date   WBC 7.1 01/05/2020   HGB 14.8 01/05/2020   HCT 40.7 01/05/2020   MCV 87.7 01/05/2020   PLT 248 01/05/2020   Lab Results  Component Value Date   NA 132 (L) 09/27/2020   K 4.6 09/27/2020   CO2 23 09/27/2020   GLUCOSE 101 (H) 09/27/2020   BUN 21 09/27/2020   CREATININE 0.91 09/27/2020   BILITOT 0.5 12/26/2018   ALKPHOS 97 12/26/2018   AST 21 12/26/2018   ALT 23 12/26/2018   PROT 6.7 12/26/2018   ALBUMIN 4.4 09/27/2020   CALCIUM 9.3 09/27/2020   ANIONGAP 11 01/05/2020   Lab Results  Component Value Date   CHOL 139 12/26/2018   Lab Results  Component Value Date   HDL 47 12/26/2018   Lab Results  Component Value Date   LDLCALC 75 12/26/2018   Lab Results  Component Value Date   TRIG 84 12/26/2018   Lab Results  Component Value Date   CHOLHDL 3.0 12/26/2018   No results found for: HGBA1C     Assessment & Plan:   1. Laceration of scalp, initial encounter Stumbled and fell against a coffee table at home causing  abrasion/laceration to the left side of his head on 12-20-20. No loss of consciousness, headache or vision disturbance. Laceration no longer bleeding and sealed well without signs of infection. Keep area clean and dry. Recheck prn.  2. Primary hypertension Systolic pressure high. Will add Losartan 50 mg qd instead of Lisinpril to his Veralan 120 mg qd. - losartan (COZAAR) 50 MG tablet; Take 1 tablet (50 mg total) by mouth daily.  Dispense: 90 tablet; Refill: 3   Juluis Mire, CMA

## 2021-02-07 ENCOUNTER — Other Ambulatory Visit: Payer: Self-pay | Admitting: Family Medicine

## 2021-02-27 NOTE — Progress Notes (Unsigned)
Subjective:   Travis Austin is a 87 y.o. male who presents for Medicare Annual/Subsequent preventive examination.  I connected with Travis Austin today by telephone and verified that I am speaking with the correct person using two identifiers. Location patient: home Location provider: work Persons participating in the virtual visit: patient, provider.   I discussed the limitations, risks, security and privacy concerns of performing an evaluation and management service by telephone and the availability of in person appointments. I also discussed with the patient that there may be a patient responsible charge related to this service. The patient expressed understanding and verbally consented to this telephonic visit.    Interactive audio and video telecommunications were attempted between this provider and patient, however failed, due to patient having technical difficulties OR patient did not have access to video capability.  We continued and completed visit with audio only.   Review of Systems    N/A  Cardiac Risk Factors include: advanced age (>41men, >56 women);male gender;hypertension;sedentary lifestyle     Objective:    There were no vitals filed for this visit. There is no height or weight on file to calculate BMI.  Advanced Directives 02/28/2021 02/24/2020 01/04/2020 08/06/2018 10/31/2017 08/13/2017 01/23/2017  Does Patient Have a Medical Advance Directive? No No No No No No Yes  Type of Advance Directive - - - - - - Living will;Healthcare Power of [place] in Chart? - - - - - - No - copy requested  Would patient like information on creating a medical advance directive? No - Patient declined No - Patient declined No - Patient declined No - Patient declined No - Patient declined No - Patient declined -    Current Medications (verified) Outpatient Encounter Medications as of 02/28/2021  Medication Sig  . acetaminophen (TYLENOL) 325 MG tablet  Take 1,000 mg by mouth every 6 (six) hours as needed.  Marland Kitchen losartan (COZAAR) 50 MG tablet Take 1 tablet (50 mg total) by mouth daily.  . Multiple Vitamin (MULTIVITAMIN WITH MINERALS) TABS tablet Take 1 tablet daily by mouth.  . SYNTHROID 25 MCG tablet TAKE 1 TABLET DAILY BEFORE BREAKFAST  . verapamil (VERELAN PM) 120 MG 24 hr capsule Take 1 capsule (120 mg total) by mouth daily.  . [DISCONTINUED] aspirin EC 81 MG tablet Take 81 mg by mouth daily.    No facility-administered encounter medications on file as of 02/28/2021.    Allergies (verified) Indapamide and Alpha-gal   History: Past Medical History:  Diagnosis Date  . Cancer ([place])    SKIN  . Hearing aid worn    bilateral  . Heart murmur    CHILDHOOD  . History of adenomatous polyp of colon   . Hypertension   . Hyponatremia    Past Surgical History:  Procedure Laterality Date  . CATARACT EXTRACTION W/PHACO Left 08/13/2017   Procedure: CATARACT EXTRACTION PHACO AND INTRAOCULAR LENS PLACEMENT ([place]) LEFT;  Surgeon: Eulogio Bear, MD;  Location: [facility];  Service: Ophthalmology;  Laterality: Left;  . CATARACT EXTRACTION W/PHACO Right 10/31/2017   Procedure: CATARACT EXTRACTION PHACO AND INTRAOCULAR LENS PLACEMENT ([facility]);  Surgeon: Eulogio Bear, MD;  Location: [facility];  Service: Ophthalmology;  Laterality: Right;  Lot: [id number] J [place]: 00:58.3 AP%: 14.0 CDE: 8.11  . COLONOSCOPY     Family History  Problem Relation Age of Onset  . Arthritis Mother   . Parkinson's disease Mother   . Heart disease Father   . Hypertension  Father   . Cancer Brother        prostate   Social History   Socioeconomic History  . Marital status: Widowed    Spouse name: Not on file  . Number of children: 1  . Years of education: Not on file  . Highest education level: Professional school degree (e.g., MD, DDS, DVM, JD)  Occupational History  . Occupation: Retired  Tobacco Use  . Smoking status: Never Smoker  . Smokeless  tobacco: Never Used  Vaping Use  . Vaping Use: Never used  Substance and Sexual Activity  . Alcohol use: Yes    Alcohol/week: 7.0 standard drinks    Types: 7 Shots of liquor per week    Comment: 1 drink a day  . Drug use: No  . Sexual activity: Not on file  Other Topics Concern  . Not on file  Social History Narrative  . Not on file   Social Determinants of Health   Financial Resource Strain: Low Risk   . Difficulty of Paying Living Expenses: Not hard at all  Food Insecurity: No Food Insecurity  . Worried About Charity fundraiser in the Last Year: Never true  . Ran Out of Food in the Last Year: Never true  Transportation Needs: No Transportation Needs  . Lack of Transportation (Medical): No  . Lack of Transportation (Non-Medical): No  Physical Activity: Insufficiently Active  . Days of Exercise per Week: 3 days  . Minutes of Exercise per Session: 40 min  Stress: No Stress Concern Present  . Feeling of Stress : Not at all  Social Connections: Moderately Isolated  . Frequency of Communication with Friends and Family: Three times a week  . Frequency of Social Gatherings with Friends and Family: More than three times a week  . Attends Religious Services: Never  . Active Member of Clubs or Organizations: Yes  . Attends Archivist Meetings: More than 4 times per year  . Marital Status: Widowed    Tobacco Counseling Counseling given: Not Answered   Clinical Intake:  Pre-visit preparation completed: Yes  Pain : No/denies pain     Nutritional Risks: None Diabetes: No  How often do you need to have someone help you when you read instructions, pamphlets, or other written materials from your doctor or pharmacy?: 1 - Never  Diabetic? No  Interpreter Needed?: No  Information entered by :: [employer], LPN   Activities of Daily Living In your present state of health, do you have any difficulty performing the following activities: 02/28/2021 12/22/2020  Hearing?  Tempie Donning  Comment Wears bilateral hearing aids. -  Vision? N N  Difficulty concentrating or making decisions? N N  Walking or climbing stairs? N N  Dressing or bathing? N N  Doing errands, shopping? N N  Preparing Food and eating ? N -  Using the Toilet? N -  In the past six months, have you accidently leaked urine? N -  Do you have problems with loss of bowel control? N -  Managing your Medications? N -  Managing your Finances? N -  Housekeeping or managing your Housekeeping? N -  Some recent data might be hidden    Patient Care Team: Birdie Sons, MD as PCP - General (Family Medicine) Eulogio Bear, MD as Consulting Physician (Ophthalmology) Ralene Bathe, MD as Consulting Physician (Dermatology)  Indicate any recent Medical Services you may have received from other than [facility] providers in the past year (date may  be approximate).     Assessment:   This is a routine wellness examination for Travis Austin.  Hearing/Vision screen No exam data present  Dietary issues and exercise activities discussed: Current Exercise Habits: Home exercise routine, Type of exercise: Other - see comments (rides a stationary bike), Time (Minutes): 45, Frequency (Times/Week): 3, Weekly Exercise (Minutes/Week): 135, Intensity: Mild, Exercise limited by: None identified  Goals    . LIFESTYLE - DECREASE FALLS RISK     Recommend to remove any items from the home that may cause slips or trips.      Depression Screen PHQ 2/9 Scores 02/28/2021 12/22/2020 02/24/2020 08/06/2018 02/11/2018 01/23/2017 01/23/2016  PHQ - 2 Score 0 0 0 0 0 0 0    Fall Risk Fall Risk  02/28/2021 12/22/2020 02/24/2020 11/11/2019 08/06/2018  Falls in the past year? 1 1 0 1 Yes  Comment - - - Emmi Telephone Survey: data to providers prior to load -  Number falls in past yr: 0 1 1 1 1   Comment - - - Emmi Telephone Survey Actual Response = 1 -  Injury with Fall? 0 1 0 0 No  Risk for fall due to : - - Impaired mobility;Impaired  balance/gait - -  Follow up Falls prevention discussed - Falls prevention discussed - Falls prevention discussed    FALL RISK PREVENTION PERTAINING TO THE HOME:  Any stairs in or around the home? Yes  If so, are there any without handrails? No  Home free of loose throw rugs in walkways, pet beds, electrical cords, etc? Yes  Adequate lighting in your home to reduce risk of falls? Yes   ASSISTIVE DEVICES UTILIZED TO PREVENT FALLS:  Life alert? No  Use of a cane, walker or w/c? Yes  Grab bars in the bathroom? Yes  Shower chair or bench in shower? No  Elevated toilet seat or a handicapped toilet? No    Cognitive Function: Normal cognitive status assessed by observation by this Nurse Health Advisor. No abnormalities found.          Immunizations Immunization History  Administered Date(s) Administered  . Influenza, High Dose Seasonal PF 08/21/2017, 09/11/2018  . Influenza-Unspecified 09/17/2015, 09/11/2018, 08/05/2020  . PFIZER(Purple Top)SARS-COV-2 Vaccination 01/31/2020, 02/23/2020, 09/17/2020  . Pneumococcal Conjugate-13 01/21/2015  . Pneumococcal Polysaccharide-23 07/02/2001  . Td 05/29/2004  . Tdap 08/24/2011  . Zoster 11/24/2008  . Zoster Recombinat (Shingrix) 12/26/2018, 02/25/2019    TDAP status: Up to date  Flu Vaccine status: Up to date  Pneumococcal vaccine status: Up to date  Covid-19 vaccine status: Completed vaccines  Qualifies for Shingles Vaccine? Yes   Zostavax completed Yes   Shingrix Completed?: Yes  Screening Tests Health Maintenance  Topic Date Due  . COLONOSCOPY (Pts 45-40yrs Insurance coverage will need to be confirmed)  04/07/2017  . TETANUS/TDAP  08/23/2021  . INFLUENZA VACCINE  Completed  . COVID-19 Vaccine  Completed  . PNA vac Low Risk Adult  Completed  . HPV VACCINES  Aged Out    Health Maintenance  Health Maintenance Due  Topic Date Due  . COLONOSCOPY (Pts 45-21yrs Insurance coverage will need to be confirmed)  04/07/2017     Colorectal cancer screening: Declined referral today.   Lung Cancer Screening: (Low Dose CT Chest recommended if Age 15-80 years, 30 pack-year currently smoking OR have quit w/in 15years.) does not qualify.   Additional Screening:  Vision Screening: Recommended annual ophthalmology exams for early detection of glaucoma and other disorders of the eye. Is the patient up  to date with their annual eye exam?  Yes  Who is the provider or what is the name of the office in which the patient attends annual eye exams? Dr Edison Pace @ [facility] If pt is not established with a provider, would they like to be referred to a provider to establish care? No .   Dental Screening: Recommended annual dental exams for proper oral hygiene  Community Resource Referral / Chronic Care Management: CRR required this visit?  No   CCM required this visit?  No      Plan:     I have personally reviewed and noted the following in the patient's chart:   . Medical and social history . Use of alcohol, tobacco or illicit drugs  . Current medications and supplements . Functional ability and status . Nutritional status . Physical activity . Advanced directives . List of other physicians . Hospitalizations, surgeries, and ER visits in previous 12 months . Vitals . Screenings to include cognitive, depression, and falls . Referrals and appointments  In addition, I have reviewed and discussed with patient certain preventive protocols, quality metrics, and best practice recommendations. A written personalized care plan for preventive services as well as general preventive health recommendations were provided to patient.     McKenzie [place], [place]   6/73/[id number]   Nurse Notes: Pt declined a colonoscopy referral at this time. Pt to discuss continuing these screenings with PCP at f/u apt.

## 2021-02-28 ENCOUNTER — Ambulatory Visit (INDEPENDENT_AMBULATORY_CARE_PROVIDER_SITE_OTHER): Payer: Medicare Other

## 2021-02-28 ENCOUNTER — Other Ambulatory Visit: Payer: Self-pay

## 2021-02-28 DIAGNOSIS — Z0000 Encounter for general adult medical examination without abnormal findings: Secondary | ICD-10-CM | POA: Diagnosis not present

## 2021-02-28 NOTE — Patient Instructions (Signed)
Travis Austin , Thank you for taking time to come for your Medicare Wellness Visit. I appreciate your ongoing commitment to your health goals. Please review the following plan we discussed and let me know if I can assist you in the future.   Screening recommendations/referrals: Colonoscopy: Currently due, declined referral today.  Recommended yearly ophthalmology/optometry visit for glaucoma screening and checkup Recommended yearly dental visit for hygiene and checkup  Vaccinations: Influenza vaccine: Done 08/05/20 Pneumococcal vaccine: Completed series Tdap vaccine: Up to date, due 08/2021 Shingles vaccine: Completed series    Advanced directives: Advance directive discussed with you today. Even though you declined this today please call our office should you change your mind and we can give you the proper paperwork for you to fill out.  Conditions/risks identified: Fall risk preventatives discussed today.   Next appointment: 03/03/21 @ 2:40 PM with Dr Caryn Section   Preventive Care 85 Years and Older, Male Preventive care refers to lifestyle choices and visits with your health care provider that can promote health and wellness. What does preventive care include?  A yearly physical exam. This is also called an annual well check.  Dental exams once or twice a year.  Routine eye exams. Ask your health care provider how often you should have your eyes checked.  Personal lifestyle choices, including:  Daily care of your teeth and gums.  Regular physical activity.  Eating a healthy diet.  Avoiding tobacco and drug use.  Limiting alcohol use.  Practicing safe sex.  Taking low doses of aspirin every day.  Taking vitamin and mineral supplements as recommended by your health care provider. What happens during an annual well check? The services and screenings done by your health care provider during your annual well check will depend on your age, overall health, lifestyle risk factors, and  family history of disease. Counseling  Your health care provider may ask you questions about your:  Alcohol use.  Tobacco use.  Drug use.  Emotional well-being.  Home and relationship well-being.  Sexual activity.  Eating habits.  History of falls.  Memory and ability to understand (cognition).  Work and work Statistician. Screening  You may have the following tests or measurements:  Height, weight, and BMI.  Blood pressure.  Lipid and cholesterol levels. These may be checked every 5 years, or more frequently if you are over 74 years old.  Skin check.  Lung cancer screening. You may have this screening every year starting at age 85 if you have a 30-pack-year history of smoking and currently smoke or have quit within the past 15 years.  Fecal occult blood test (FOBT) of the stool. You may have this test every year starting at age 81.  Flexible sigmoidoscopy or colonoscopy. You may have a sigmoidoscopy every 5 years or a colonoscopy every 10 years starting at age 85.  Prostate cancer screening. Recommendations will vary depending on your family history and other risks.  Hepatitis C blood test.  Hepatitis B blood test.  Sexually transmitted disease (STD) testing.  Diabetes screening. This is done by checking your blood sugar (glucose) after you have not eaten for a while (fasting). You may have this done every 1-3 years.  Abdominal aortic aneurysm (AAA) screening. You may need this if you are a current or former smoker.  Osteoporosis. You may be screened starting at age 42 if you are at high risk. Talk with your health care provider about your test results, treatment options, and if necessary, the need for more  tests. Vaccines  Your health care provider may recommend certain vaccines, such as:  Influenza vaccine. This is recommended every year.  Tetanus, diphtheria, and acellular pertussis (Tdap, Td) vaccine. You may need a Td booster every 10 years.  Zoster  vaccine. You may need this after age 82.  Pneumococcal 13-valent conjugate (PCV13) vaccine. One dose is recommended after age 26.  Pneumococcal polysaccharide (PPSV23) vaccine. One dose is recommended after age 35. Talk to your health care provider about which screenings and vaccines you need and how often you need them. This information is not intended to replace advice given to you by your health care provider. Make sure you discuss any questions you have with your health care provider. Document Released: 12/30/2015 Document Revised: 08/22/2016 Document Reviewed: 10/04/2015 Elsevier Interactive Patient Education  2017 [place] Prevention in the Home Falls can cause injuries. They can happen to people of all ages. There are many things you can do to make your home safe and to help prevent falls. What can I do on the outside of my home?  Regularly fix the edges of walkways and driveways and fix any cracks.  Remove anything that might make you trip as you walk through a door, such as a raised step or threshold.  Trim any bushes or trees on the path to your home.  Use bright outdoor lighting.  Clear any walking paths of anything that might make someone trip, such as rocks or tools.  Regularly check to see if handrails are loose or broken. Make sure that both sides of any steps have handrails.  Any raised decks and porches should have guardrails on the edges.  Have any leaves, snow, or ice cleared regularly.  Use sand or salt on walking paths during winter.  Clean up any spills in your garage right away. This includes oil or grease spills. What can I do in the bathroom?  Use night lights.  Install grab bars by the toilet and in the tub and shower. Do not use towel bars as grab bars.  Use non-skid mats or decals in the tub or shower.  If you need to sit down in the shower, use a plastic, non-slip stool.  Keep the floor dry. Clean up any water that spills on the  floor as soon as it happens.  Remove soap buildup in the tub or shower regularly.  Attach bath mats securely with double-sided non-slip rug tape.  Do not have throw rugs and other things on the floor that can make you trip. What can I do in the bedroom?  Use night lights.  Make sure that you have a light by your bed that is easy to reach.  Do not use any sheets or blankets that are too big for your bed. They should not hang down onto the floor.  Have a firm chair that has side arms. You can use this for support while you get dressed.  Do not have throw rugs and other things on the floor that can make you trip. What can I do in the kitchen?  Clean up any spills right away.  Avoid walking on wet floors.  Keep items that you use a lot in easy-to-reach places.  If you need to reach something above you, use a strong step stool that has a grab bar.  Keep electrical cords out of the way.  Do not use floor polish or wax that makes floors slippery. If you must use wax, use non-skid floor  wax.  Do not have throw rugs and other things on the floor that can make you trip. What can I do with my stairs?  Do not leave any items on the stairs.  Make sure that there are handrails on both sides of the stairs and use them. Fix handrails that are broken or loose. Make sure that handrails are as long as the stairways.  Check any carpeting to make sure that it is firmly attached to the stairs. Fix any carpet that is loose or worn.  Avoid having throw rugs at the top or bottom of the stairs. If you do have throw rugs, attach them to the floor with carpet tape.  Make sure that you have a light switch at the top of the stairs and the bottom of the stairs. If you do not have them, ask someone to add them for you. What else can I do to help prevent falls?  Wear shoes that:  Do not have high heels.  Have rubber bottoms.  Are comfortable and fit you well.  Are closed at the toe. Do not wear  sandals.  If you use a stepladder:  Make sure that it is fully opened. Do not climb a closed stepladder.  Make sure that both sides of the stepladder are locked into place.  Ask someone to hold it for you, if possible.  Clearly mark and make sure that you can see:  Any grab bars or handrails.  First and last steps.  Where the edge of each step is.  Use tools that help you move around (mobility aids) if they are needed. These include:  Canes.  Walkers.  Scooters.  Crutches.  Turn on the lights when you go into a dark area. Replace any light bulbs as soon as they burn out.  Set up your furniture so you have a clear path. Avoid moving your furniture around.  If any of your floors are uneven, fix them.  If there are any pets around you, be aware of where they are.  Review your medicines with your doctor. Some medicines can make you feel dizzy. This can increase your chance of falling. Ask your doctor what other things that you can do to help prevent falls. This information is not intended to replace advice given to you by your health care provider. Make sure you discuss any questions you have with your health care provider. Document Released: 09/29/2009 Document Revised: 05/10/2016 Document Reviewed: 01/07/2015 Elsevier Interactive Patient Education  2017 [employer].

## 2021-03-03 ENCOUNTER — Ambulatory Visit (INDEPENDENT_AMBULATORY_CARE_PROVIDER_SITE_OTHER): Payer: Medicare Other | Admitting: Family Medicine

## 2021-03-03 ENCOUNTER — Encounter: Payer: Self-pay | Admitting: Family Medicine

## 2021-03-03 ENCOUNTER — Other Ambulatory Visit: Payer: Self-pay

## 2021-03-03 VITALS — BP 195/91 | HR 66 | Temp 98.2°F | Resp 18 | Ht 69.0 in | Wt 166.0 lb

## 2021-03-03 DIAGNOSIS — I10 Essential (primary) hypertension: Secondary | ICD-10-CM | POA: Diagnosis not present

## 2021-03-03 DIAGNOSIS — E039 Hypothyroidism, unspecified: Secondary | ICD-10-CM | POA: Diagnosis not present

## 2021-03-03 NOTE — Patient Instructions (Signed)
.   Please review the attached list of medications and notify my office if there are any errors.   . Please bring all of your medications to every appointment so we can make sure that our medication list is the same as yours.   

## 2021-03-03 NOTE — Progress Notes (Signed)
I,Travis Austin,acting as a scribe for Travis Huh, MD.,have documented all relevant documentation on the behalf of Travis Huh, MD,as directed by  Travis Huh, MD while in the presence of Travis Huh, MD.   Established patient visit   Patient: Travis Austin   DOB: May 10, 1931   87 y.o. Male  MRN: [id number] Visit Date: 03/03/2021  Today's healthcare provider: Lelon Huh, MD   Chief Complaint  Patient presents with  . Follow-up   Subjective    HPI  Hypertension, follow-up  BP Readings from Last 3 Encounters:  03/03/21 (!) 195/91  12/22/20 (!) 221/86  09/27/20 (!) 190/73   Wt Readings from Last 3 Encounters:  03/03/21 166 lb (75.3 kg)  12/22/20 164 lb (74.4 kg)  09/27/20 162 lb 3.2 oz (73.6 kg)     He was last seen for hypertension 4 months ago.  BP at that visit was 190/73. Management since that visit includes; continue current medication for now.  He reports good compliance with treatment. He is not having side effects. none He is exercising. He is adherent to low salt diet.   Outside blood pressures are 11/63-120/62.  He does not smoke.  Use of agents associated with hypertension: none.   -----------------------------------------------------------------  Hypothyroid, follow-up  Lab Results  Component Value Date   TSH 2.657 01/02/2020   TSH 1.670 12/26/2018   TSH 2.640 10/14/2018   Wt Readings from Last 3 Encounters:  03/03/21 166 lb (75.3 kg)  12/22/20 164 lb (74.4 kg)  09/27/20 162 lb 3.2 oz (73.6 kg)    He was last seen for hypothyroid 01/02/2020.  Management since that visit includes; on Synthroid. He reports good compliance with treatment. He is not having side effects. none  Symptoms: No change in energy level No constipation  No diarrhea No heat / cold intolerance  No nervousness No palpitations  No weight changes    -----------------------------------------------------------------      Medications: Outpatient  Medications Prior to Visit  Medication Sig  . acetaminophen (TYLENOL) 325 MG tablet Take 1,000 mg by mouth every 6 (six) hours as needed.  Marland Kitchen losartan (COZAAR) 50 MG tablet Take 1 tablet (50 mg total) by mouth daily.  . Multiple Vitamin (MULTIVITAMIN WITH MINERALS) TABS tablet Take 1 tablet daily by mouth.  . SYNTHROID 25 MCG tablet TAKE 1 TABLET DAILY BEFORE BREAKFAST  . verapamil (VERELAN PM) 120 MG 24 hr capsule Take 1 capsule (120 mg total) by mouth daily.   No facility-administered medications prior to visit.    Review of Systems  Constitutional: Negative for appetite change, chills and fever.  Respiratory: Negative for chest tightness, shortness of breath and wheezing.   Cardiovascular: Negative for chest pain and palpitations.  Gastrointestinal: Negative for abdominal pain, nausea and vomiting.       Objective    BP (!) 195/91 (BP Location: Left Arm, Patient Position: Sitting, Cuff Size: Normal)   Pulse 66   Temp 98.2 F (36.8 C) (Oral)   Resp 18   Ht 5\' 9"  (1.753 m)   Wt 166 lb (75.3 kg)   SpO2 97%   BMI 24.51 kg/m     Physical Exam   General appearance: Well developed, well nourished male, cooperative and in no acute distress Head: Normocephalic, without obvious abnormality, atraumatic Respiratory: Respirations even and unlabored, normal respiratory rate Extremities: All extremities are intact.  Skin: Skin color, texture, turgor normal. No rashes seen  Psych: Appropriate mood and affect. Neurologic: Mental status: Alert, oriented to  person, place, and time, thought content appropriate.    Assessment & Plan     1. Primary hypertension He brings in copy of home blood pressure reading with systolic blood pressures consistently in the 120s and 130s. Continue current medications.   - Renal function panel  2. Hypothyroidism, unspecified type  - T4, free - TSH   No follow-ups on file.      The entirety of the information documented in the History of Present  Illness, Review of Systems and Physical Exam were personally obtained by me. Portions of this information were initially documented by the CMA and reviewed by me for thoroughness and accuracy.      Travis Huh, MD  [facility] [phone] (phone) [phone] (fax)  [place]

## 2021-03-04 LAB — RENAL FUNCTION PANEL
Albumin: 4.3 g/dL (ref 3.6–4.6)
BUN/Creatinine Ratio: 29 — ABNORMAL HIGH (ref 10–24)
BUN: 26 mg/dL (ref 8–27)
CO2: 22 mmol/L (ref 20–29)
Calcium: 9.4 mg/dL (ref 8.6–10.2)
Chloride: 96 mmol/L (ref 96–106)
Creatinine, Ser: 0.9 mg/dL (ref 0.76–1.27)
Glucose: 110 mg/dL — ABNORMAL HIGH (ref 65–99)
Phosphorus: 3.1 mg/dL (ref 2.8–4.1)
Potassium: 4.7 mmol/L (ref 3.5–5.2)
Sodium: 133 mmol/L — ABNORMAL LOW (ref 134–144)
eGFR: 83 mL/min/{1.73_m2} (ref >59)

## 2021-03-04 LAB — T4, FREE: Free T4: 1.72 ng/dL (ref 0.82–1.77)

## 2021-03-04 LAB — TSH: TSH: 2.46 u[IU]/mL (ref 0.450–4.500)

## 2021-03-06 ENCOUNTER — Telehealth: Payer: Self-pay

## 2021-03-06 NOTE — Telephone Encounter (Signed)
-----   Message from Birdie Sons, MD sent at 03/06/2021  7:01 AM EDT ----- Labs are good. Sodium is stable at 133. Continue current medications.  Follow up 6 months.

## 2021-03-06 NOTE — Telephone Encounter (Signed)
I called pt and pt verbalized understanding of information below. Pt has been scheduled for OV 6 mo from now.

## 2021-05-01 ENCOUNTER — Other Ambulatory Visit: Payer: Self-pay

## 2021-05-01 ENCOUNTER — Ambulatory Visit (INDEPENDENT_AMBULATORY_CARE_PROVIDER_SITE_OTHER): Payer: Medicare Other | Admitting: Family Medicine

## 2021-05-01 ENCOUNTER — Ambulatory Visit
Admission: RE | Admit: 2021-05-01 | Discharge: 2021-05-01 | Disposition: A | Discharge status: Home / Self Care | Payer: Medicare Other | Source: Ambulatory Visit | Attending: Family Medicine | Admitting: Family Medicine

## 2021-05-01 ENCOUNTER — Encounter: Payer: Self-pay | Admitting: Family Medicine

## 2021-05-01 ENCOUNTER — Ambulatory Visit
Admission: RE | Admit: 2021-05-01 | Discharge: 2021-05-01 | Disposition: A | Discharge status: Home / Self Care | Payer: Medicare Other | Attending: Family Medicine | Admitting: Family Medicine

## 2021-05-01 VITALS — BP 185/77 | HR 64 | Temp 98.2°F | Resp 16 | Wt 165.0 lb

## 2021-05-01 DIAGNOSIS — G8929 Other chronic pain: Secondary | ICD-10-CM | POA: Diagnosis not present

## 2021-05-01 DIAGNOSIS — M25561 Pain in right knee: Secondary | ICD-10-CM

## 2021-05-01 NOTE — Progress Notes (Signed)
      Established patient visit   Patient: Travis Austin   DOB: August 25, 1931   87 y.o. Male  MRN: [id number] Visit Date: 05/01/2021  Today's healthcare provider: Lelon Huh, MD   Chief Complaint  Patient presents with  . Knee Pain   Subjective    Knee Pain  There was no injury mechanism. The pain is present in the right knee. The quality of the pain is described as aching. The pain is moderate. The pain has been constant since onset. Pertinent negatives include no numbness or tingling. The symptoms are aggravated by movement. He has tried heat, ice, elevation and rest for the symptoms. The treatment provided mild relief.    Patient reports that he has had pain for several years, but the pain has worsened over the last few months.      Medications: Outpatient Medications Prior to Visit  Medication Sig  . acetaminophen (TYLENOL) 325 MG tablet Take 1,000 mg by mouth every 6 (six) hours as needed.  Marland Kitchen losartan (COZAAR) 50 MG tablet Take 1 tablet (50 mg total) by mouth daily.  . Multiple Vitamin (MULTIVITAMIN WITH MINERALS) TABS tablet Take 1 tablet daily by mouth.  . SYNTHROID 25 MCG tablet TAKE 1 TABLET DAILY BEFORE BREAKFAST  . verapamil (VERELAN PM) 120 MG 24 hr capsule Take 1 capsule (120 mg total) by mouth daily.   No facility-administered medications prior to visit.    Review of Systems  Neurological: Negative for tingling and numbness.       Objective    BP (!) 185/77   Pulse 64   Temp 98.2 F (36.8 C)   Resp 16   Wt 165 lb (74.8 kg)   BMI 24.37 kg/m     Physical Exam   Slightly tender along joint line of right knee with minimal swelling and no erythema.   Assessment & Plan     1. Chronic pain of right knee  - DG Knee Complete 4 Views Right; Future       The entirety of the information documented in the History of Present Illness, Review of Systems and Physical Exam were personally obtained by me. Portions of this information were initially documented  by the CMA and reviewed by me for thoroughness and accuracy.      Lelon Huh, MD  [facility] [phone] (phone) [phone] (fax)  [place]

## 2021-05-01 NOTE — Patient Instructions (Signed)
.   Go to the [facility] across the street on [address] for right knee Xray

## 2021-05-05 ENCOUNTER — Telehealth: Payer: Self-pay

## 2021-05-05 DIAGNOSIS — M25561 Pain in right knee: Secondary | ICD-10-CM

## 2021-05-05 DIAGNOSIS — G8929 Other chronic pain: Secondary | ICD-10-CM

## 2021-05-05 NOTE — Telephone Encounter (Signed)
Advised patient of results. Patient is willing to try ortho. Order has been placed.

## 2021-05-05 NOTE — Telephone Encounter (Signed)
-----   Message from Birdie Sons, MD sent at 05/03/2021 12:12 PM EDT ----- Severe arthritis in knee joint. Can't really say if this will require knee replacement, there are some injections that the orthopedist do that sometimes help quite a bit. Need to be referred to orthopedics.

## 2021-05-08 ENCOUNTER — Other Ambulatory Visit: Payer: Self-pay | Admitting: Family Medicine

## 2021-05-24 DIAGNOSIS — H40003 Preglaucoma, unspecified, bilateral: Secondary | ICD-10-CM | POA: Diagnosis not present

## 2021-05-29 ENCOUNTER — Emergency Department: Payer: Medicare Other

## 2021-05-29 ENCOUNTER — Emergency Department
Admission: EM | Admit: 2021-05-29 | Discharge: 2021-05-29 | Disposition: A | Discharge status: Home / Self Care | Payer: Medicare Other | Attending: Emergency Medicine | Admitting: Emergency Medicine

## 2021-05-29 ENCOUNTER — Other Ambulatory Visit: Payer: Self-pay

## 2021-05-29 DIAGNOSIS — R001 Bradycardia, unspecified: Secondary | ICD-10-CM | POA: Insufficient documentation

## 2021-05-29 DIAGNOSIS — R61 Generalized hyperhidrosis: Secondary | ICD-10-CM | POA: Insufficient documentation

## 2021-05-29 DIAGNOSIS — I10 Essential (primary) hypertension: Secondary | ICD-10-CM | POA: Insufficient documentation

## 2021-05-29 DIAGNOSIS — E039 Hypothyroidism, unspecified: Secondary | ICD-10-CM | POA: Insufficient documentation

## 2021-05-29 DIAGNOSIS — Z85828 Personal history of other malignant neoplasm of skin: Secondary | ICD-10-CM | POA: Insufficient documentation

## 2021-05-29 DIAGNOSIS — Z79899 Other long term (current) drug therapy: Secondary | ICD-10-CM | POA: Diagnosis not present

## 2021-05-29 LAB — T4, FREE: Free T4: 1.08 ng/dL (ref 0.61–1.12)

## 2021-05-29 LAB — BASIC METABOLIC PANEL
Anion gap: 8 (ref 5–15)
BUN: 29 mg/dL — ABNORMAL HIGH (ref 8–23)
CO2: 22 mmol/L (ref 22–32)
Calcium: 8.8 mg/dL — ABNORMAL LOW (ref 8.9–10.3)
Chloride: 103 mmol/L (ref 98–111)
Creatinine, Ser: 0.88 mg/dL (ref 0.61–1.24)
GFR, Estimated: >60 mL/min (ref >60)
Glucose, Bld: 131 mg/dL — ABNORMAL HIGH (ref 70–99)
Potassium: 4.1 mmol/L (ref 3.5–5.1)
Sodium: 133 mmol/L — ABNORMAL LOW (ref 135–145)

## 2021-05-29 LAB — CBC
HCT: 45.6 % (ref 39.0–52.0)
Hemoglobin: 16.1 g/dL (ref 13.0–17.0)
MCH: 32.7 pg (ref 26.0–34.0)
MCHC: 35.3 g/dL (ref 30.0–36.0)
MCV: 92.7 fL (ref 80.0–100.0)
Platelets: 223 10*3/uL (ref 150–400)
RBC: 4.92 MIL/uL (ref 4.22–5.81)
RDW: 11.9 % (ref 11.5–15.5)
WBC: 6.4 10*3/uL (ref 4.0–10.5)
nRBC: 0 % (ref 0.0–0.2)

## 2021-05-29 LAB — TROPONIN I (HIGH SENSITIVITY)
Troponin I (High Sensitivity): 6 ng/L (ref <18)
Troponin I (High Sensitivity): 7 ng/L (ref <18)

## 2021-05-29 LAB — MAGNESIUM: Magnesium: 2.1 mg/dL (ref 1.7–2.4)

## 2021-05-29 LAB — TSH: TSH: 2.361 u[IU]/mL (ref 0.350–4.500)

## 2021-05-29 NOTE — ED Provider Notes (Addendum)
[facility] Emergency Department Provider Note  ____________________________________________   Event Date/Time   First MD Initiated Contact with Patient 05/29/21 1426     (approximate)  I have reviewed the triage vital signs and the nursing notes.   HISTORY  Chief Complaint Bradycardia    HPI Travis Austin is a 87 y.o. male with hypertension who is on verapamil who comes in for concerns for bradycardia.  Patient states that he has been feeling fine but he intermittently checks his oxygen levels which also have his heart rates on it.  He states that his heart rates have been low into the 30s to 40s.  Denies ever having this previously.  No history of cardiac disease.  Does not see a cardiologist.  States that he is not been feeling dizzy, short of breath or having any chest pain.  Otherwise feels his normal self.  These low heart rates have been going on since Friday, intermittent, nothing makes it better, nothing makes them worse.  The triage note stated that patient was diaphoretic and clammy but patient denies any of this or any symptoms from his low heart rates          Past Medical History:  Diagnosis Date   Cancer [facility])    SKIN   Hearing aid worn    bilateral   Heart murmur    CHILDHOOD   History of adenomatous polyp of colon    Hypertension    Hyponatremia     Patient Active Problem List   Diagnosis Date Noted   Hyponatremia 01/02/2020   Hypothyroid 02/11/2018   BPH (benign prostatic hypertrophy) 01/20/2016   Hypertension 01/20/2016   History of adenomatous polyp of colon 04/07/2012    Past Surgical History:  Procedure Laterality Date   CATARACT EXTRACTION W/PHACO Left 08/13/2017   Procedure: CATARACT EXTRACTION PHACO AND INTRAOCULAR LENS PLACEMENT ([place]) LEFT;  Surgeon: Eulogio Bear, MD;  Location: [place];  Service: Ophthalmology;  Laterality: Left;   CATARACT EXTRACTION W/PHACO Right 10/31/2017   Procedure:  CATARACT EXTRACTION PHACO AND INTRAOCULAR LENS PLACEMENT ([facility]);  Surgeon: Eulogio Bear, MD;  Location: [facility] ORS;  Service: Ophthalmology;  Laterality: Right;  Lot: [id number] J [place]: 00:58.3 AP%: 14.0 CDE: 8.11   COLONOSCOPY      Prior to Admission medications   Medication Sig Start Date End Date Taking? Authorizing Provider  acetaminophen (TYLENOL) 325 MG tablet Take 1,000 mg by mouth every 6 (six) hours as needed.    [provider]  levothyroxine (SYNTHROID) 25 MCG tablet Take 1 tablet (25 mcg total) by mouth daily before breakfast. 05/08/21   Birdie Sons, MD  losartan (COZAAR) 50 MG tablet Take 1 tablet (50 mg total) by mouth daily. 12/22/20   Chrismon, Vickki Muff, PA-C  Multiple Vitamin (MULTIVITAMIN WITH MINERALS) TABS tablet Take 1 tablet daily by mouth.    [provider]  verapamil (VERELAN PM) 120 MG 24 hr capsule Take 1 capsule (120 mg total) by mouth daily. 11/09/20   Birdie Sons, MD    Allergies Indapamide and Alpha-gal  Family History  Problem Relation Age of Onset   Arthritis Mother    Parkinson's disease Mother    Heart disease Father    Hypertension Father    Cancer Brother        prostate    Social History Social History   Tobacco Use   Smoking status: Never   Smokeless tobacco: Never  Vaping Use   Vaping  Use: Never used  Substance Use Topics   Alcohol use: Yes    Alcohol/week: 7.0 standard drinks    Types: 7 Shots of liquor per week    Comment: 1 drink a day   Drug use: No      Review of Systems Constitutional: No fever/chills Eyes: No visual changes. ENT: No sore throat. Cardiovascular: Denies chest pain. Respiratory: Denies shortness of breath.  Positive low heart rate Gastrointestinal: No abdominal pain.  No nausea, no vomiting.  No diarrhea.  No constipation. Genitourinary: Negative for dysuria. Musculoskeletal: Negative for back pain. Skin: Negative for rash. Neurological: Negative for headaches, focal weakness or  numbness. All other ROS negative ____________________________________________   PHYSICAL EXAM:  VITAL SIGNS: ED Triage Vitals  Enc Vitals Group     BP 05/29/21 1420 (!) 161/69     Pulse Rate 05/29/21 1420 (!) 36     Resp 05/29/21 1420 18     Temp 05/29/21 1420 98 F (36.7 C)     Temp src --      SpO2 05/29/21 1420 100 %     Weight --      Height --      Head Circumference --      Peak Flow --      Pain Score 05/29/21 1416 0     Pain Loc --      Pain Edu? --      Excl. in [place]? --     Constitutional: Alert and oriented. Well appearing and in no acute distress. Eyes: Conjunctivae are normal. EOMI. Head: Atraumatic. Nose: No congestion/rhinnorhea. Mouth/Throat: Mucous membranes are moist.   Neck: No stridor. Trachea Midline. FROM Cardiovascular: Bradycardic grossly normal heart sounds.  Good peripheral circulation. Respiratory: Normal respiratory effort.  No retractions. Lungs CTAB. Gastrointestinal: Soft and nontender. No distention. No abdominal bruits.  Musculoskeletal: No lower extremity tenderness nor edema.  No joint effusions. Neurologic:  Normal speech and language. No gross focal neurologic deficits are appreciated.  Skin:  Skin is warm, dry and intact. No rash noted. Psychiatric: Mood and affect are normal. Speech and behavior are normal. GU: Deferred   ____________________________________________   LABS (all labs ordered are listed, but only abnormal results are displayed)  Labs Reviewed  BASIC METABOLIC PANEL - Abnormal; Notable for the following components:      Result Value   Sodium 133 (*)    Glucose, Bld 131 (*)    BUN 29 (*)    Calcium 8.8 (*)    All other components within normal limits  CBC  TSH  T4, FREE  MAGNESIUM  TROPONIN I (HIGH SENSITIVITY)  TROPONIN I (HIGH SENSITIVITY)   ____________________________________________   ED ECG REPORT I, Travis Austin, the attending physician, personally viewed and interpreted this ECG.  Sinus rate  of 70 without any ST elevation, no T wave inversions, right bundle branch block with PACs  Sinus bradycardia rate of 47 with occasional PAC, right bundle branch block, no ST elevation, T wave version  Sinus bradycardia rate of 37, no ST elevation, no T wave versions, right bundle branch block ____________________________________________  RADIOLOGY I, Travis Austin, personally viewed and evaluated these images (plain radiographs) as part of my medical decision making, as well as reviewing the written report by the radiologist.  ED MD interpretation: No pneumonia  Official radiology report(s): DG Chest Port 1 View  Result Date: 05/29/2021 CLINICAL DATA:  Bradycardia EXAM: PORTABLE CHEST 1 VIEW COMPARISON:  01/02/2020 FINDINGS: The heart size and  mediastinal contours are within normal limits. Atelectasis/scarring at the left lung base. No pleural effusion or pneumothorax. No acute osseous abnormality. IMPRESSION: Atelectasis/scarring at the left lung base. Electronically Signed   By: Macy Mis M.D.   On: 05/29/2021 14:54    ____________________________________________   PROCEDURES  Procedure(s) performed (including Critical Care):  .1-3 Lead EKG Interpretation  Date/Time: 05/29/2021 4:38 PM Performed by: Travis Southport, MD Authorized by: Travis Woodford, MD     Interpretation: abnormal     ECG rate:  30-70s   ECG rate assessment: bradycardic     Rhythm: sinus bradycardia     Ectopy: PAC     Conduction: normal   Comments:     Patient is sinus bradycardia with occasional PAC.  Occasionally he goes into a few beats of bigeminy.  He typically is not perfusing his PACs but occasionally will.   ____________________________________________   INITIAL IMPRESSION / ASSESSMENT AND PLAN / ED COURSE  BAIN WHICHARD was evaluated in Emergency Department on 05/29/2021 for the symptoms described in the history of present illness. He was evaluated in the context of the global COVID-19  pandemic, which necessitated consideration that the patient might be at risk for infection with the SARS-CoV-2 virus that causes COVID-19. Institutional protocols and algorithms that pertain to the evaluation of patients at risk for COVID-19 are in a state of rapid change based on information released by regulatory bodies including the CDC and federal and state organizations. These policies and algorithms were followed during the patient's care in the ED.    Patient is an 87 year old with hypertension who comes with bradycardia.  Patient's heart rates have been ranging into the 30s to 60s with PACs that are occasionally bigeminy.  Labs ordered to evaluate for Electra MIs, AKI, thyroid, ACS.  Patient be kept on the cardiac monitor  Contrary to triage note patient was not diaphoretic upon evaluation.  He states that he felt a little bit of sweatiness because it was hot outside and the room is hot when he first got here but states that he has not had any sweatiness  with the low heart rates he does not think it is associated with that.  He is adamant that he had no symptoms with his low heart rates  Labs are reassuring.  Reevaluated patient and his telemetry and he continues to be in sinus bradycardia with occasional PACs.  Will discuss with cardiology given he is asymptomatic   Discussed with Dr. Ubaldo Glassing recommend dc the verapamil and will follow up in clinic.  Patient is instructed to call the clinic at 9 AM to either be seen versus at least have a Holter monitor put on.  We will hold off on starting any other blood pressure medicine.  Patient is hypertensive here but is not taking his losartan.  We will see what his blood pressures are doing at the cardiology clinic and maybe he will need to be started on a new medication.  Discussed with patient and he would prefer going home and being admitted to the hospital for cardiac monitoring given he states that he feels fine.  He does understand that if he develops  symptoms from the low heart rates that he needs to return to the ER immediately  I discussed the provisional nature of ED diagnosis, the treatment so far, the ongoing plan of care, follow up appointments and return precautions with the patient and any family or support people present. They expressed understanding and agreed  with the plan, discharged home.            ____________________________________________   FINAL CLINICAL IMPRESSION(S) / ED DIAGNOSES   Final diagnoses:  Bradycardia      MEDICATIONS GIVEN DURING THIS VISIT:  Medications - No data to display   ED Discharge Orders     None        Note:  This document was prepared using Dragon voice recognition software and may include unintentional dictation errors.    Travis [place], MD 05/29/21 1736    Travis K. I. Sawyer, MD 05/29/21 1739

## 2021-05-29 NOTE — ED Triage Notes (Signed)
Pt comes with c/o bradycardia. Pt states he checked his pulse at home and it got down to 36. Pt is diaphoretic and clammy. Pt denies any CP or SOb.

## 2021-05-29 NOTE — Discharge Instructions (Addendum)
Please call the cardiology office at 9 AM tomorrow morning to see if he can be seen that day or at least have a Holter monitor put on.  Your heart rates are low but given you have no symptoms we are going to let you go home.  Stop taking your verapamil.  However you develop worsening symptoms, shortness of breath, feel like you are to pass out or any other concerns and you need to return to the ER immediately

## 2021-05-30 ENCOUNTER — Telehealth: Payer: Self-pay

## 2021-05-30 DIAGNOSIS — R001 Bradycardia, unspecified: Secondary | ICD-10-CM

## 2021-05-30 NOTE — Telephone Encounter (Signed)
There's already a message about this. The only thing we can do is put him on a Zio monitor which would be useless because it's not in real time and  takes weeks to get back and we already know from his ER EKG that he is bradycardic. An urgent referral has been placed to get in with cardiologist. Also, the verapamil causes bradycardia so stopping the verapamil may fix the problem. You can also call sarah to make sure the referral is getting processed.

## 2021-05-30 NOTE — Telephone Encounter (Signed)
Copied from [place] [id number]. Topic: General - Inquiry >> May 30, 2021  9:49 AM Loma Boston wrote: Reason for CRM: Grandaughter, Mickel Baas has called and states pt was in ER last nite and released with specific instructions to have a heart monitor asap. His heart rate is going into the 30's just all over the place. Since time of release last nite has not dropped yet but that is the concern in which they made sure it was very important he have a monitor asap. He has no cardio dr and she has reached out to cardiologist team at hospital but they cannot see till the 21st. She is panicked due to this and wants help if possible with earlier appt. If her grandad can be called at [phone] or you may call her , Mickel Baas at [phone]. She is frantic isgoing to happen again. FU for advice.

## 2021-05-30 NOTE — Telephone Encounter (Signed)
Reviewed EKG from ER. He doesn't need a heart monitor, he needs a pacemaker. Please enter urgent referral to cardiology for severe sinus bradycardia.

## 2021-05-30 NOTE — Telephone Encounter (Signed)
Copied from [place] [id number]. Topic: General - Inquiry >> May 30, 2021  9:49 AM Loma Boston wrote: Reason for CRM: Grandaughter, Mickel Baas has called and states pt was in ER last nite and released with specific instructions to have a heart monitor asap. His heart rate is going into the 30's just all over the place. Since time of release last nite has not dropped yet but that is the concern in which they made sure it was very important he have a monitor asap. He has no cardio dr and she has reached out to cardiologist team at hospital but they cannot see till the 21st. She is panicked due to this and wants help if possible with earlier appt. If her grandad can be called at [phone] or you may call her , Mickel Baas at [phone]. She is frantic isgoing to happen again. FU for advice. >> May 30, 2021  2:09 PM Keene Breath wrote: Patient's granddaughter, Ruel Dimmick, called to ask the nurse or doctor to call her number regarding referral at [phone].  She stated she is not sure if she is listed on the patient's DPR.  Please advise and call to discuss.

## 2021-05-30 NOTE — Telephone Encounter (Signed)
Copied from [place] [id number]. Topic: General - Inquiry >> May 30, 2021  9:49 AM Loma Boston wrote: Reason for CRM: Grandaughter, Mickel Baas has called and states pt was in ER last nite and released with specific instructions to have a heart monitor asap. His heart rate is going into the 30's just all over the place. Since time of release last nite has not dropped yet but that is the concern in which they made sure it was very important he have a monitor asap. He has no cardio dr and she has reached out to cardiologist team at hospital but they cannot see till the 21st. She is panicked due to this and wants help if possible with earlier appt. If her grandad can be called at [phone] or you may call her , Mickel Baas at [phone]. She is frantic isgoing to happen again. FU for advice. >> May 30, 2021  2:09 PM Keene Breath wrote: Patient's granddaughter, Rodgers Likes, called to ask the nurse or doctor to call her number regarding referral at [phone].  She stated she is not sure if she is listed on the patient's DPR.  Please advise and call to discuss.

## 2021-05-31 NOTE — Telephone Encounter (Signed)
I returned patient's granddaughter Mickel Baas) call. She advised me that patient has an appointment with Cardiology tomorrow 06/01/2021 at 2pm with Dr. Fletcher Anon.

## 2021-06-01 ENCOUNTER — Ambulatory Visit (INDEPENDENT_AMBULATORY_CARE_PROVIDER_SITE_OTHER): Payer: Medicare Other | Admitting: Cardiovascular Disease

## 2021-06-01 ENCOUNTER — Other Ambulatory Visit: Payer: Self-pay

## 2021-06-01 ENCOUNTER — Encounter: Payer: Self-pay | Admitting: Cardiovascular Disease

## 2021-06-01 VITALS — BP 200/82 | HR 64 | Ht 69.0 in | Wt 162.2 lb

## 2021-06-01 DIAGNOSIS — I10 Essential (primary) hypertension: Secondary | ICD-10-CM

## 2021-06-01 DIAGNOSIS — I491 Atrial premature depolarization: Secondary | ICD-10-CM | POA: Diagnosis not present

## 2021-06-01 DIAGNOSIS — R001 Bradycardia, unspecified: Secondary | ICD-10-CM | POA: Diagnosis not present

## 2021-06-01 MED ORDER — AMLODIPINE BESYLATE 5 MG PO TABS: 5.0000 mg | ORAL_TABLET | Freq: Every day | ORAL | 3 refills | Status: DC

## 2021-06-01 NOTE — Progress Notes (Signed)
Cardiology Office Note   Date:  06/01/2021   ID:  Travis Austin, DOB 11-May-1931, MRN [id number]  PCP:  Birdie Sons, MD  Cardiologist:  Kathlyn Sacramento, MD   Chief Complaint  Patient presents with   Other    Bradycardia. Meds reviewed verbally with pt.      History of Present Illness: Travis Austin is a 87 y.o. male who was referred by Dr. Caryn Section for evaluation of bradycardia.  The patient has known history of essential hypertension previously on verapamil.  No previous cardiac history.  The patient reports prolonged history of essential hypertension and has been on antihypertensive medications for many years.  He was on verapamil 120 mg once daily. He went to the emergency room recently due to bradycardia noted while he was checking his pulse ox.  He noted that his heart rate was in the 30s and 40s.  He denies chest pain, shortness of breath or dizziness.  His heart rate on presentation was 37 bpm.  He was noted to have frequent PACs.  Verapamil was discontinued.  I reviewed his labs which were normal including troponin, thyroid function and CBC.  Basic metabolic profile showed stable hyponatremia at 133.  The patient is hard of hearing.  He denies chest pain, shortness of breath or palpitations.  No dizziness, syncope or presyncope.  He reports having whitecoat syndrome and his blood pressure is always better when he is home.  No previous cardiac history.    Past Medical History:  Diagnosis Date   Cancer [facility])    SKIN   Hearing aid worn    bilateral   Heart murmur    CHILDHOOD   History of adenomatous polyp of colon    Hypertension    Hyponatremia     Past Surgical History:  Procedure Laterality Date   CATARACT EXTRACTION W/PHACO Left 08/13/2017   Procedure: CATARACT EXTRACTION PHACO AND INTRAOCULAR LENS PLACEMENT ([place]) LEFT;  Surgeon: Travis Bear, MD;  Location: [place];  Service: Ophthalmology;  Laterality: Left;   CATARACT EXTRACTION W/PHACO  Right 10/31/2017   Procedure: CATARACT EXTRACTION PHACO AND INTRAOCULAR LENS PLACEMENT ([facility]);  Surgeon: Travis Bear, MD;  Location: [facility];  Service: Ophthalmology;  Laterality: Right;  Lot: [id number] J [place]: 00:58.3 AP%: 14.0 CDE: 8.11   COLONOSCOPY       Current Outpatient Medications  Medication Sig Dispense Refill   acetaminophen (TYLENOL) 325 MG tablet Take 1,000 mg by mouth every 6 (six) hours as needed.     levothyroxine (SYNTHROID) 25 MCG tablet Take 1 tablet (25 mcg total) by mouth daily before breakfast. 90 tablet 4   losartan (COZAAR) 50 MG tablet Take 1 tablet (50 mg total) by mouth daily. 90 tablet 3   Multiple Vitamin (MULTIVITAMIN WITH MINERALS) TABS tablet Take 1 tablet daily by mouth.     No current facility-administered medications for this visit.    Allergies:   Indapamide and Alpha-gal    Social History:  The patient  reports that he has never smoked. He has never used smokeless tobacco. He reports current alcohol use of about 7.0 standard drinks of alcohol per week. He reports that he does not use drugs.   Family History:  The patient's family history includes Arthritis in his mother; Cancer in his brother; Heart disease in his father; Hypertension in his father; Parkinson's disease in his mother.    ROS:  Please see the history of present illness.   Otherwise, review of  systems are positive for none.   All other systems are reviewed and negative.    PHYSICAL EXAM: VS:  BP (!) 200/82 (BP Location: Right Arm, Patient Position: Sitting, Cuff Size: Normal)   Pulse 64   Ht 5\' 9"  (1.753 m)   Wt 162 lb 4 oz (73.6 kg)   SpO2 98%   BMI 23.96 kg/m  , BMI Body mass index is 23.96 kg/m. GEN: Well nourished, well developed, in no acute distress  HEENT: normal  Neck: no JVD, carotid bruits, or masses Cardiac: RRR with premature beats; no murmurs, rubs, or gallops,no edema  Respiratory:  clear to auscultation bilaterally, normal work of breathing GI: soft,  nontender, nondistended, + BS MS: no deformity or atrophy  Skin: warm and dry, no rash Neuro:  Strength and sensation are intact Psych: euthymic mood, full affect   EKG:  EKG is ordered today. The ekg ordered today demonstrates sinus rhythm with sinus arrhythmia with right bundle branch block.   Recent Labs: 05/29/2021: BUN 29; Creatinine, Ser 0.88; Hemoglobin 16.1; Magnesium 2.1; Platelets 223; Potassium 4.1; Sodium 133; TSH 2.361    Lipid Panel    Component Value Date/Time   CHOL 139 12/26/2018 1509   TRIG 84 12/26/2018 1509   HDL 47 12/26/2018 1509   CHOLHDL 3.0 12/26/2018 1509   LDLCALC 75 12/26/2018 1509      Wt Readings from Last 3 Encounters:  06/01/21 162 lb 4 oz (73.6 kg)  05/01/21 165 lb (74.8 kg)  03/03/21 166 lb (75.3 kg)       PAD Screen 06/01/2021  Previous PAD dx? No  Previous surgical procedure? No  Pain with walking? No  Feet/toe relief with dangling? No  Painful, non-healing ulcers? No  Extremities discolored? No      ASSESSMENT AND PLAN:  1.  Asymptomatic bradycardia: The patient had recent bradycardia but overall was asymptomatic.  He was on verapamil 120 mg daily for many years for essential hypertension.  His labs were overall unremarkable.  I agree with stopping amlodipine.  He is no longer bradycardic and does not seem to have symptoms at the present time.  He does have underlying right bundle branch block and thus he is at increased risk for requiring pacemaker in the future but currently there is no indication.  We will continue to follow the patient closely.  Given that he has no symptoms, I am not going to obtain a monitor at the present time.  2.  Essential hypertension: He seems to have a component of whitecoat syndrome.  His blood pressure is severely elevated today greater than 200.  He reports that his blood pressure is close to normal at home.  He is on losartan 50 mg daily.  I elected to add amlodipine 5 mg once daily.  3.  PACs: He  does have frequent PACs but does not seem to be symptomatic.  No indication for treatment of this.    Disposition:   FU with me in 6 months  Signed,  Kathlyn Sacramento, MD  06/01/2021 2:02 PM    [facility]

## 2021-06-01 NOTE — Patient Instructions (Signed)
Medication Instructions:  START Amlodipine 5 mg once daily  *If you need a refill on your cardiac medications before your next appointment, please call your pharmacy*  Lab Work: None LABS WILL APPEAR ON MYCHART, ABNORMAL RESULTS WILL BE CALLED  Testing/Procedures: None  Follow-Up: At [facility], you and your health needs are our priority.  As part of our continuing mission to provide you with exceptional heart care, we have created designated Provider Care Teams.  These Care Teams include your primary Cardiologist (physician) and Advanced Practice Providers (APPs -  Physician Assistants and Nurse Practitioners) who all work together to provide you with the care you need, when you need it.  Your next appointment:   6 month(s)  The format for your next appointment:   In Person  Provider:   You may see Dr. Fletcher Anon or one of the following Advanced Practice Providers on your designated Care Team:   Murray Hodgkins, NP Christell Faith, PA-C Marrianne Mood, PA-C Cadence Hague, [place]

## 2021-06-08 DIAGNOSIS — M170 Bilateral primary osteoarthritis of knee: Secondary | ICD-10-CM | POA: Diagnosis not present

## 2021-06-18 ENCOUNTER — Telehealth: Payer: Self-pay | Admitting: Family Medicine

## 2021-06-18 NOTE — Telephone Encounter (Signed)
Patient had verapamil stopped a few weeks ago due to bradycardia. Can you please have him schedule follow up sometime this month to check on his blood pressure since stopping medication.

## 2021-06-20 NOTE — Telephone Encounter (Signed)
Apt 07/25/2021 at 2:40pm  Thanks,   -Mickel Baas

## 2021-07-25 ENCOUNTER — Other Ambulatory Visit: Payer: Self-pay

## 2021-07-25 ENCOUNTER — Ambulatory Visit (INDEPENDENT_AMBULATORY_CARE_PROVIDER_SITE_OTHER): Payer: Medicare Other | Admitting: Family Medicine

## 2021-07-25 ENCOUNTER — Encounter: Payer: Self-pay | Admitting: Family Medicine

## 2021-07-25 VITALS — BP 174/84 | HR 78 | Temp 99.7°F | Resp 18 | Wt 166.0 lb

## 2021-07-25 DIAGNOSIS — R600 Localized edema: Secondary | ICD-10-CM | POA: Diagnosis not present

## 2021-07-25 DIAGNOSIS — I10 Essential (primary) hypertension: Secondary | ICD-10-CM

## 2021-07-25 MED ORDER — AMLODIPINE BESYLATE 2.5 MG PO TABS: 2.5000 mg | ORAL_TABLET | Freq: Every day | ORAL | 1 refills | Status: DC

## 2021-07-25 NOTE — Progress Notes (Signed)
Established patient visit   Patient: Travis Austin   DOB: 1931/11/26   87 y.o. Male  MRN: [id number] Visit Date: 07/25/2021  Today's healthcare provider: Lelon Huh, MD   Chief Complaint  Patient presents with   Hypertension   Subjective    HPI  Hypertension, follow-up  BP Readings from Last 3 Encounters:  07/25/21 (!) 174/84  06/01/21 (!) 200/82  05/29/21 (!) 184/74   Wt Readings from Last 3 Encounters:  07/25/21 166 lb (75.3 kg)  06/01/21 162 lb 4 oz (73.6 kg)  05/01/21 165 lb (74.8 kg)     Last medication change includes stopping Verapamil due to bradycardia and starting Amlodipine.   He reports good compliance with treatment. He is having side effects. Throat congestion and swelling in ankles and feet. He is following a Regular diet. He is not exercising. He does not smoke.  Use of agents associated with hypertension: none.   Outside blood pressures are 114-127/ 65-72. Symptoms: No chest pain No chest pressure  No palpitations No syncope  No dyspnea No orthopnea  No paroxysmal nocturnal dyspnea Yes lower extremity edema   Pertinent labs: Lab Results  Component Value Date   CHOL 139 12/26/2018   HDL 47 12/26/2018   LDLCALC 75 12/26/2018   TRIG 84 12/26/2018   CHOLHDL 3.0 12/26/2018   Lab Results  Component Value Date   NA 133 (L) 05/29/2021   K 4.1 05/29/2021   CREATININE 0.88 05/29/2021   GFRNONAA >60 05/29/2021   GFRAA 87 09/27/2020   GLUCOSE 131 (H) 05/29/2021     The ASCVD Risk score (Goff DC Jr., et al., 2013) failed to calculate for the following reasons:   The 2013 ASCVD risk score is only valid for ages 50 to 29   ---------------------------------------------------------------------------------------------------  He also reports he has felt some congestion in his throat for the last week which he thought may be related to amlodipine.      Medications: Outpatient Medications Prior to Visit  Medication Sig   acetaminophen  (TYLENOL) 325 MG tablet Take 1,000 mg by mouth every 6 (six) hours as needed.   amLODipine (NORVASC) 5 MG tablet Take 1 tablet (5 mg total) by mouth daily.   levothyroxine (SYNTHROID) 25 MCG tablet Take 1 tablet (25 mcg total) by mouth daily before breakfast.   losartan (COZAAR) 50 MG tablet Take 1 tablet (50 mg total) by mouth daily.   Multiple Vitamin (MULTIVITAMIN WITH MINERALS) TABS tablet Take 1 tablet daily by mouth.   No facility-administered medications prior to visit.    Review of Systems  Constitutional:  Negative for appetite change, chills, fatigue and fever.  HENT:  Negative for congestion, ear pain, hearing loss, nosebleeds and trouble swallowing.        Throat congestion  Eyes:  Negative for pain and visual disturbance.  Respiratory:  Negative for cough and chest tightness.   Cardiovascular:  Positive for leg swelling.  Gastrointestinal:  Negative for abdominal pain, blood in stool, constipation, diarrhea, nausea and vomiting.  Endocrine: Negative for polydipsia, polyphagia and polyuria.  Genitourinary:  Negative for dysuria and flank pain.  Musculoskeletal:  Positive for joint swelling. Negative for arthralgias, back pain, myalgias and neck stiffness.  Skin:  Negative for color change, rash and wound.  Neurological:  Negative for dizziness, tremors, seizures, speech difficulty, weakness and light-headedness.  Psychiatric/Behavioral:  Negative for behavioral problems, confusion, decreased concentration, dysphoric mood and sleep disturbance. The patient is not nervous/anxious.   All  other systems reviewed and are negative.     Objective    BP (!) 174/84 (BP Location: Left Arm, Patient Position: Sitting, Cuff Size: Normal)   Pulse 78   Temp 99.7 F (37.6 C) (Temporal)   Resp 18   Wt 166 lb (75.3 kg)   BMI 24.51 kg/m     Physical Exam   General: Appearance:    Well developed, well nourished male in no acute distress  Eyes:    PERRL, conjunctiva/corneas clear,  EOM's intact       Lungs:     Clear to auscultation bilaterally, respirations unlabored  Heart:    Normal heart rate. Normal rhythm. No murmurs, rubs, or gallops.    MS:   All extremities are intact.  2+ edema right foot, 1+ on left.   Neurologic:   Awake, alert, oriented x 3. No apparent focal neurological defect.        Assessment & Plan     1. Primary hypertension Off verapamil due to bradycardia. Home blood pressures consistently in the 120s/70s on current medications, but having significant LE edema which is bothering him, will reduce amlodipine from '5mg'$  to 22 amLODipine (NORVASC) 2.5 MG tablet; Take 1 tablet (2.5 mg total) by mouth daily.  Dispense: 90 tablet; Refill: 1  2. Localized edema Secondary to amlodipine as above.    Future Appointments  Date Time Provider [place]  08/29/2021  3:40 PM Birdie Sons, MD [facility]  03/06/2022  2:00 PM BFP-NURSE HEALTH ADVISOR [facility]    Will be due to check sodium at follow up in September.         Lelon Huh, MD  [facility] [phone] (phone) [phone] (fax)  [place]

## 2021-08-29 ENCOUNTER — Other Ambulatory Visit: Payer: Self-pay

## 2021-08-29 ENCOUNTER — Ambulatory Visit (INDEPENDENT_AMBULATORY_CARE_PROVIDER_SITE_OTHER): Payer: Medicare Other | Admitting: Family Medicine

## 2021-08-29 ENCOUNTER — Encounter: Payer: Self-pay | Admitting: Family Medicine

## 2021-08-29 VITALS — BP 157/71 | HR 57 | Wt 164.0 lb

## 2021-08-29 DIAGNOSIS — Z23 Encounter for immunization: Secondary | ICD-10-CM | POA: Diagnosis not present

## 2021-08-29 DIAGNOSIS — E039 Hypothyroidism, unspecified: Secondary | ICD-10-CM | POA: Diagnosis not present

## 2021-08-29 DIAGNOSIS — E871 Hypo-osmolality and hyponatremia: Secondary | ICD-10-CM | POA: Diagnosis not present

## 2021-08-29 DIAGNOSIS — I10 Essential (primary) hypertension: Secondary | ICD-10-CM

## 2021-08-29 NOTE — Progress Notes (Signed)
Established patient visit   Patient: Travis Austin   DOB: [birth year]   87 y.o. Male  MRN: [id number] Visit Date: 08/29/2021  Today's healthcare provider: Lelon Huh, MD   No chief complaint on file.  Subjective    HPI  Hypertension, follow-up  BP Readings from Last 3 Encounters:  08/29/21 (!) 157/71  07/25/21 (!) 174/84  06/01/21 (!) 200/82   Wt Readings from Last 3 Encounters:  08/29/21 164 lb (74.4 kg)  07/25/21 166 lb (75.3 kg)  06/01/21 162 lb 4 oz (73.6 kg)     He was last seen for hypertension 1  month  ago.  BP at that visit was 174/84. Management since that visit includes reducing amlodipine to 2.'5mg'$  due to swelling.  He reports poor compliance with treatment. Pt states he stopped taking amlodipine. He is not having side effects.  He is following a Regular diet. He is exercising. He does not smoke.  Use of agents associated with hypertension: none.   Outside blood pressures are normal at home. He brings in log today and BP are consistently in the 110s-120s/70s  Symptoms: No chest pain No chest pressure  No palpitations No syncope  No dyspnea No orthopnea  No paroxysmal nocturnal dyspnea No lower extremity edema   Pertinent labs: Lab Results  Component Value Date   CHOL 139 12/26/2018   HDL 47 12/26/2018   LDLCALC 75 12/26/2018   TRIG 84 12/26/2018   CHOLHDL 3.0 12/26/2018   Lab Results  Component Value Date   NA 133 (L) 05/29/2021   K 4.1 05/29/2021   CREATININE 0.88 05/29/2021   GFRNONAA >60 05/29/2021   GFRAA 87 09/27/2020   GLUCOSE 131 (H) 05/29/2021     The ASCVD Risk score (Arnett DK, et al., 2019) failed to calculate for the following reasons:   The 2019 ASCVD risk score is only valid for ages 56 to 3    Medications: Outpatient Medications Prior to Visit  Medication Sig   acetaminophen (TYLENOL) 325 MG tablet Take 1,000 mg by mouth every 6 (six) hours as needed.   amLODipine (NORVASC) 2.5 MG tablet Take 1 tablet (2.5 mg  total) by mouth daily.   levothyroxine (SYNTHROID) 25 MCG tablet Take 1 tablet (25 mcg total) by mouth daily before breakfast.   losartan (COZAAR) 50 MG tablet Take 1 tablet (50 mg total) by mouth daily.   Multiple Vitamin (MULTIVITAMIN WITH MINERALS) TABS tablet Take 1 tablet daily by mouth.   No facility-administered medications prior to visit.    Review of Systems  Constitutional: Negative.   Respiratory: Negative.    Cardiovascular: Negative.       Objective    BP (!) 157/71 (BP Location: Left Arm, Patient Position: Sitting, Cuff Size: Large)   Pulse (!) 57   Wt 164 lb (74.4 kg)   SpO2 99%   BMI 24.22 kg/m    Physical Exam    No results found for any visits on 08/29/21.  Assessment & Plan     1. Primary hypertension Home blood pressures very good off of amlodipine. Swelling has greatly improved. He is going to continue check BP daily and start on 2.'5mg'$  amlodipine if over 140/90. Otherwise just continue losartan.   2. Hyponatremia  - Renal function panel   3. Need for influenza vaccination  - Flu Vaccine QUAD High Dose(Fluad)       Future Appointments  Date Time Provider [place]  03/05/2022  2:00 PM Lelon Huh  E, MD [facility]    The entirety of the information documented in the History of Present Illness, Review of Systems and Physical Exam were personally obtained by me. Portions of this information were initially documented by the CMA and reviewed by me for thoroughness and accuracy.     Lelon Huh, MD  [facility] [phone] (phone) [phone] (fax)  [place]

## 2021-08-30 LAB — RENAL FUNCTION PANEL
Albumin: 4.4 g/dL (ref 3.6–4.6)
BUN/Creatinine Ratio: 27 — ABNORMAL HIGH (ref 10–24)
BUN: 24 mg/dL (ref 8–27)
CO2: 22 mmol/L (ref 20–29)
Calcium: 9 mg/dL (ref 8.6–10.2)
Chloride: 99 mmol/L (ref 96–106)
Creatinine, Ser: 0.88 mg/dL (ref 0.76–1.27)
Glucose: 118 mg/dL — ABNORMAL HIGH (ref 65–99)
Phosphorus: 3.5 mg/dL (ref 2.8–4.1)
Potassium: 4.7 mmol/L (ref 3.5–5.2)
Sodium: 135 mmol/L (ref 134–144)
eGFR: 83 mL/min/{1.73_m2} (ref >59)

## 2021-09-28 DIAGNOSIS — Z23 Encounter for immunization: Secondary | ICD-10-CM | POA: Diagnosis not present

## 2021-11-28 ENCOUNTER — Encounter: Payer: Self-pay | Admitting: Cardiovascular Disease

## 2021-11-28 ENCOUNTER — Other Ambulatory Visit: Payer: Self-pay

## 2021-11-28 ENCOUNTER — Ambulatory Visit (INDEPENDENT_AMBULATORY_CARE_PROVIDER_SITE_OTHER): Payer: Medicare Other | Admitting: Cardiovascular Disease

## 2021-11-28 VITALS — BP 168/100 | HR 73 | Ht 69.0 in | Wt 158.4 lb

## 2021-11-28 DIAGNOSIS — I10 Essential (primary) hypertension: Secondary | ICD-10-CM

## 2021-11-28 DIAGNOSIS — R001 Bradycardia, unspecified: Secondary | ICD-10-CM

## 2021-11-28 DIAGNOSIS — I491 Atrial premature depolarization: Secondary | ICD-10-CM | POA: Diagnosis not present

## 2021-11-28 NOTE — Patient Instructions (Signed)
Medication Instructions:  Your physician recommends that you continue on your current medications as directed. Please refer to the Current Medication list given to you today.  *If you need a refill on your cardiac medications before your next appointment, please call your pharmacy*   Lab Work: None ordered If you have labs (blood work) drawn today and your tests are completely normal, you will receive your results only by: [place] (if you have MyChart) OR A paper copy in the mail If you have any lab test that is abnormal or we need to change your treatment, we will call you to review the results.   Testing/Procedures: None ordered   Follow-Up: At [facility], you and your health needs are our priority.  As part of our continuing mission to provide you with exceptional heart care, we have created designated Provider Care Teams.  These Care Teams include your primary Cardiologist (physician) and Advanced Practice Providers (APPs -  Physician Assistants and Nurse Practitioners) who all work together to provide you with the care you need, when you need it.  We recommend signing up for the patient portal called "MyChart".  Sign up information is provided on this After Visit Summary.  MyChart is used to connect with patients for Virtual Visits (Telemedicine).  Patients are able to view lab/test results, encounter notes, upcoming appointments, etc.  Non-urgent messages can be sent to your provider as well.   To learn more about what you can do with MyChart, go to [email or web address].    Your next appointment:   As needed  The format for your next appointment:   In Person  Provider:   You may see Kathlyn Sacramento, MD or one of the following Advanced Practice Providers on your designated Care Team:   Murray Hodgkins, NP Christell Faith, PA-C Cadence Kathlen Mody, PA-C{    Other Instructions N/A

## 2021-11-28 NOTE — Progress Notes (Signed)
Cardiology Office Note   Date:  11/28/2021   ID:  Travis Austin, DOB 1931-02-21, MRN [id number]  PCP:  Birdie Sons, MD  Cardiologist:  Kathlyn Sacramento, MD   Chief Complaint  Patient presents with   OTHER    6 Month f/u no complaints today. Meds reviewed verbally with pt.       History of Present Illness: Travis Austin is a 87 y.o. male who is here today for follow-up visit regarding bradycardia.  The patient has known history of essential hypertension previously on verapamil.  No previous cardiac history.  The patient had bradycardia while he was taking verapamil 120 mg once daily with heart rate in the 30s and 40s.  Bradycardia improved after stopping verapamil. He has whitecoat syndrome.  During last visit, his blood pressure was greater than 200 and thus I added amlodipine 5 mg once daily.  He had worsening lower extremity edema with that and the dose was subsequently decreased to 2.5 mg with improvement.  He has been doing well with no recurrent bradycardia.  No chest pain or shortness of breath.    Past Medical History:  Diagnosis Date   Cancer [place])    SKIN   Hearing aid worn    bilateral   Heart murmur    CHILDHOOD   History of adenomatous polyp of colon    Hypertension    Hyponatremia     Past Surgical History:  Procedure Laterality Date   CATARACT EXTRACTION W/PHACO Left 08/13/2017   Procedure: CATARACT EXTRACTION PHACO AND INTRAOCULAR LENS PLACEMENT ([place]) LEFT;  Surgeon: Eulogio Bear, MD;  Location: [place] [facility];  Service: Ophthalmology;  Laterality: Left;   CATARACT EXTRACTION W/PHACO Right 10/31/2017   Procedure: CATARACT EXTRACTION PHACO AND INTRAOCULAR LENS PLACEMENT ([facility]);  Surgeon: Eulogio Bear, MD;  Location: [facility];  Service: Ophthalmology;  Laterality: Right;  Lot: [id number] J [place]: 00:58.3 AP%: 14.0 CDE: 8.11   COLONOSCOPY       Current Outpatient Medications  Medication Sig Dispense Refill   amLODipine (NORVASC) 2.5  MG tablet Take 1 tablet (2.5 mg total) by mouth daily. 90 tablet 1   levothyroxine (SYNTHROID) 25 MCG tablet Take 1 tablet (25 mcg total) by mouth daily before breakfast. 90 tablet 4   losartan (COZAAR) 50 MG tablet Take 1 tablet (50 mg total) by mouth daily. 90 tablet 3   Multiple Vitamin (MULTIVITAMIN WITH MINERALS) TABS tablet Take 1 tablet daily by mouth.     acetaminophen (TYLENOL) 325 MG tablet Take 1,000 mg by mouth every 6 (six) hours as needed. (Patient not taking: Reported on 11/28/2021)     No current facility-administered medications for this visit.    Allergies:   Indapamide, Alpha-gal, and Verapamil    Social History:  The patient  reports that he has never smoked. He has never used smokeless tobacco. He reports current alcohol use of about 7.0 standard drinks per week. He reports that he does not use drugs.   Family History:  The patient's family history includes Arthritis in his mother; Cancer in his brother; Heart disease in his father; Hypertension in his father; Parkinson's disease in his mother.    ROS:  Please see the history of present illness.   Otherwise, review of systems are positive for none.   All other systems are reviewed and negative.    PHYSICAL EXAM: VS:  BP (!) 168/100 (BP Location: Left Arm, Patient Position: Sitting, Cuff Size: Normal)    Pulse  73    Ht 5\' 9"  (1.753 m)    Wt 158 lb 6 oz (71.8 kg)    SpO2 98%    BMI 23.39 kg/m  , BMI Body mass index is 23.39 kg/m. GEN: Well nourished, well developed, in no acute distress  HEENT: normal  Neck: no JVD, carotid bruits, or masses Cardiac: RRR with premature beats; no murmurs, rubs, or gallops, trace edema  Respiratory:  clear to auscultation bilaterally, normal work of breathing GI: soft, nontender, nondistended, + BS MS: no deformity or atrophy  Skin: warm and dry, no rash Neuro:  Strength and sensation are intact Psych: euthymic mood, full affect   EKG:  EKG is ordered today. The ekg ordered today  demonstrates normal sinus rhythm with PACs and right bundle branch block.   Recent Labs: 05/29/2021: Hemoglobin 16.1; Magnesium 2.1; Platelets 223; TSH 2.361 08/29/2021: BUN 24; Creatinine, Ser 0.88; Potassium 4.7; Sodium 135    Lipid Panel    Component Value Date/Time   CHOL 139 12/26/2018 1509   TRIG 84 12/26/2018 1509   HDL 47 12/26/2018 1509   CHOLHDL 3.0 12/26/2018 1509   LDLCALC 75 12/26/2018 1509      Wt Readings from Last 3 Encounters:  11/28/21 158 lb 6 oz (71.8 kg)  08/29/21 164 lb (74.4 kg)  07/25/21 166 lb (75.3 kg)       PAD Screen 06/01/2021  Previous PAD dx? No  Previous surgical procedure? No  Pain with walking? No  Feet/toe relief with dangling? No  Painful, non-healing ulcers? No  Extremities discolored? No      ASSESSMENT AND PLAN:  1.  Asymptomatic bradycardia: This was in the setting of treatment with verapamil for hypertension.  Bradycardia resolved completely since verapamil was discontinued.  He does have right bundle branch block but has no symptoms to suggest bradycardia at the present time.  2.  Essential hypertension: He seems to have a component of whitecoat syndrome.  His blood pressure is well controlled at home on small dose amlodipine and losartan.  If additional blood pressure control is needed, losartan can be increased.  Higher dose of amlodipine was associated with increased leg edema.  3.  PACs: He does have frequent PACs but does not seem to be symptomatic.  No indication for treatment of this.    Disposition:   FU with me as needed.  Signed,  Kathlyn Sacramento, MD  11/28/2021 4:20 PM    [facility]

## 2021-12-21 ENCOUNTER — Other Ambulatory Visit: Payer: Self-pay | Admitting: Family Medicine

## 2021-12-21 DIAGNOSIS — I10 Essential (primary) hypertension: Secondary | ICD-10-CM

## 2021-12-21 MED ORDER — LOSARTAN POTASSIUM 50 MG PO TABS: 50.0000 mg | ORAL_TABLET | Freq: Every day | ORAL | 0 refills | Status: DC

## 2021-12-21 NOTE — Telephone Encounter (Signed)
Medication Refill - Medication: Losartin 50 mg  90 days  Has the patient contacted their pharmacy? Yes.  New refill per phamacy (Agent: If no, request that the patient contact the pharmacy for the refill. If patient does not wish to contact the pharmacy document the reason why and proceed with request.) (Agent: If yes, when and what did the pharmacy advise?)  Preferred Pharmacy (with phone number or street name): [facility] Has the patient been seen for an appointment in the last year OR does the patient have an upcoming appointment? Yes.    Agent: Please be advised that RX refills may take up to 3 business days. We ask that you follow-up with your pharmacy.

## 2021-12-21 NOTE — Telephone Encounter (Signed)
Requested Prescriptions  Pending Prescriptions Disp Refills   losartan (COZAAR) 50 MG tablet 90 tablet 0    Sig: Take 1 tablet (50 mg total) by mouth daily.     Cardiovascular:  Angiotensin Receptor Blockers Failed - 12/21/2021  3:34 PM      Failed - Last BP in normal range    BP Readings from Last 1 Encounters:  11/28/21 (!) 168/100         Passed - Cr in normal range and within 180 days    Creatinine, Ser  Date Value Ref Range Status  08/29/2021 0.88 0.76 - 1.27 mg/dL Final         Passed - K in normal range and within 180 days    Potassium  Date Value Ref Range Status  08/29/2021 4.7 3.5 - 5.2 mmol/L Final         Passed - Patient is not pregnant      Passed - Valid encounter within last 6 months    Recent Outpatient Visits          3 months ago Primary hypertension   [facility] Birdie Sons, MD   4 months ago Primary hypertension   [facility] Birdie Sons, MD   7 months ago Chronic pain of right knee   [facility] Birdie Sons, MD   9 months ago Primary hypertension   [facility] Birdie Sons, MD   12 months ago Laceration of scalp, initial encounter   [place], Vickki Muff, [place]

## 2021-12-25 ENCOUNTER — Other Ambulatory Visit: Payer: Self-pay | Admitting: Family Medicine

## 2021-12-25 DIAGNOSIS — I10 Essential (primary) hypertension: Secondary | ICD-10-CM

## 2022-01-01 IMAGING — DX DG CHEST 1V PORT
1 series · 1 of 1 positions shown · non-contrast
Comparison: None.

CLINICAL DATA: 88-year-old male with weakness.

EXAM:
PORTABLE CHEST 1 VIEW

[chest ap]
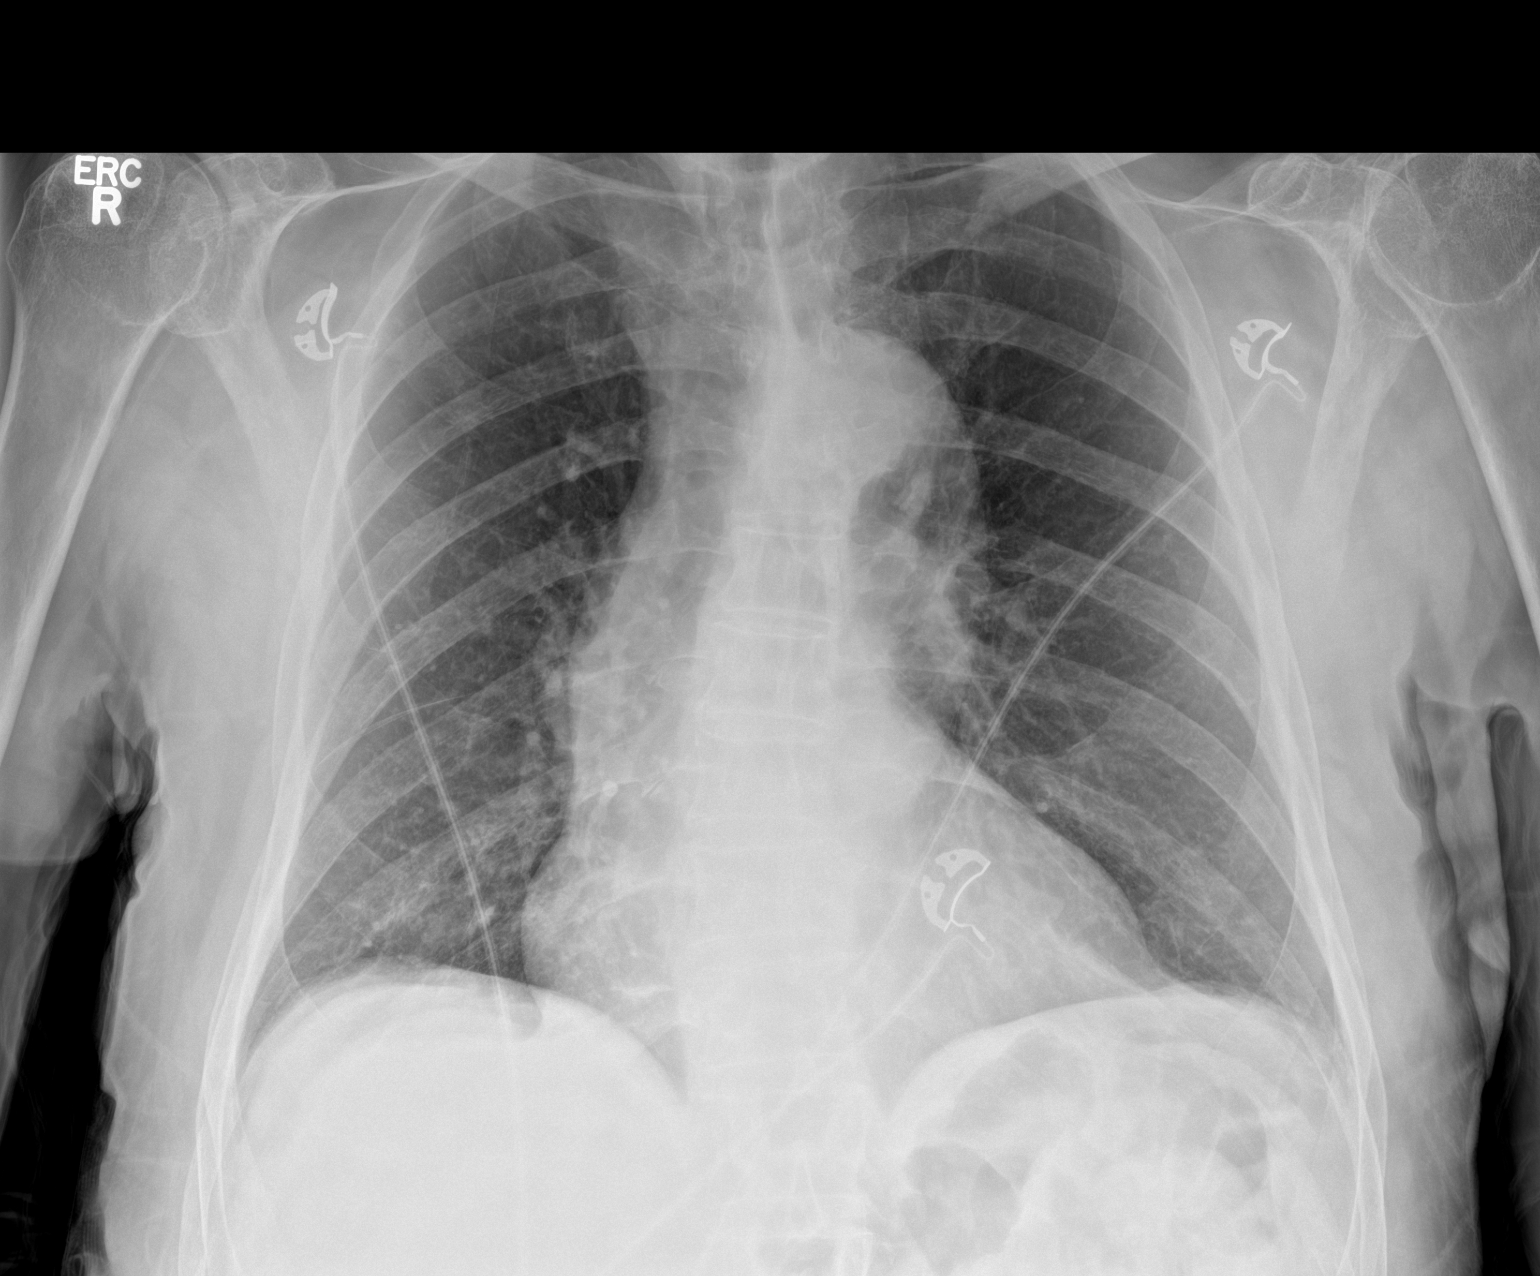

[1 of 1 positions shown; findings below may reference images not displayed]

FINDINGS: No focal consolidation, pleural effusion, pneumothorax. The cardiac
silhouette is within normal limits. The aorta is mildly tortuous. No
acute osseous pathology.
IMPRESSION: No active disease.

## 2022-02-06 ENCOUNTER — Other Ambulatory Visit: Payer: Self-pay | Admitting: Family Medicine

## 2022-02-06 DIAGNOSIS — I10 Essential (primary) hypertension: Secondary | ICD-10-CM

## 2022-02-06 NOTE — Telephone Encounter (Signed)
Medication Refill - Medication:  losartan (COZAAR) 50 MG tablet Has the patient contacted their pharmacy? Yes.   (Agent: If no, request that the patient contact the pharmacy for the refill. If patient does not wish to contact the pharmacy document the reason why and proceed with request.) (Agent: If yes, when and what did the pharmacy advise?)  Preferred Pharmacy (with phone number or street name): [facility]  Has the patient been seen for an appointment in the last year OR does the patient have an upcoming appointment? Yes.    Agent: Please be advised that RX refills may take up to 3 business days. We ask that you follow-up with your pharmacy.

## 2022-02-07 MED ORDER — LOSARTAN POTASSIUM 50 MG PO TABS: 50.0000 mg | ORAL_TABLET | Freq: Every day | ORAL | 0 refills | Status: DC

## 2022-02-07 NOTE — Telephone Encounter (Signed)
Requested Prescriptions  Pending Prescriptions Disp Refills   losartan (COZAAR) 50 MG tablet 90 tablet 0    Sig: Take 1 tablet (50 mg total) by mouth daily.     Cardiovascular:  Angiotensin Receptor Blockers Failed - 02/06/2022  4:38 PM      Failed - Last BP in normal range    BP Readings from Last 1 Encounters:  11/28/21 (!) 168/100         Passed - Cr in normal range and within 180 days    Creatinine, Ser  Date Value Ref Range Status  08/29/2021 0.88 0.76 - 1.27 mg/dL Final         Passed - K in normal range and within 180 days    Potassium  Date Value Ref Range Status  08/29/2021 4.7 3.5 - 5.2 mmol/L Final         Passed - Patient is not pregnant      Passed - Valid encounter within last 6 months    Recent Outpatient Visits          5 months ago Primary hypertension   [facility] Birdie Sons, MD   6 months ago Primary hypertension   [facility] Birdie Sons, MD   9 months ago Chronic pain of right knee   [facility] Birdie Sons, MD   11 months ago Primary hypertension   [facility] Birdie Sons, MD   1 year ago Laceration of scalp, initial encounter   [place], Vickki Muff, [place]

## 2022-03-05 ENCOUNTER — Encounter: Payer: Medicare Other | Admitting: Family Medicine

## 2022-04-26 ENCOUNTER — Other Ambulatory Visit: Payer: Self-pay | Admitting: Family Medicine

## 2022-04-27 ENCOUNTER — Encounter: Payer: Self-pay | Admitting: Family Medicine

## 2022-04-27 ENCOUNTER — Ambulatory Visit (INDEPENDENT_AMBULATORY_CARE_PROVIDER_SITE_OTHER): Payer: Medicare Other | Admitting: Family Medicine

## 2022-04-27 VITALS — BP 162/80 | HR 59 | Wt 151.5 lb

## 2022-04-27 DIAGNOSIS — E039 Hypothyroidism, unspecified: Secondary | ICD-10-CM | POA: Diagnosis not present

## 2022-04-27 DIAGNOSIS — I10 Essential (primary) hypertension: Secondary | ICD-10-CM

## 2022-04-27 DIAGNOSIS — Z1211 Encounter for screening for malignant neoplasm of colon: Secondary | ICD-10-CM

## 2022-04-27 DIAGNOSIS — E871 Hypo-osmolality and hyponatremia: Secondary | ICD-10-CM | POA: Diagnosis not present

## 2022-04-27 DIAGNOSIS — R739 Hyperglycemia, unspecified: Secondary | ICD-10-CM | POA: Diagnosis not present

## 2022-04-27 DIAGNOSIS — H6122 Impacted cerumen, left ear: Secondary | ICD-10-CM | POA: Diagnosis not present

## 2022-04-27 DIAGNOSIS — Z0000 Encounter for general adult medical examination without abnormal findings: Secondary | ICD-10-CM

## 2022-04-27 NOTE — Progress Notes (Signed)
? ? ?I,Sha'taria Tyson,acting as a scribe for Lelon Huh, MD.,have documented all relevant documentation on the behalf of Lelon Huh, MD,as directed by  Lelon Huh, MD while in the presence of Lelon Huh, MD. ? ? ?Annual Wellness Visit ? ?  ? ?Patient: Travis Austin, Male    DOB: [birth year], 88 y.o.   MRN: [id number] ?Visit Date: 04/27/2022 ? ?Today's Provider: Lelon Huh, MD  ? ?No chief complaint on file. ? ?Subjective  ?  ?Travis Austin is a 88 y.o. male who presents today for his Annual Wellness Visit. ?He reports consuming a low sodium diet. Gym/ health club routine includes stationary bike. He generally feels well. He reports sleeping well. He does not have additional problems to discuss today.  ? ? ?-Wants to know if he should get colonoscopy and if not that than what other type of cancer screening. He did have tubular adenoma removed by Dr Candace Cruise at his last colonoscopy in 2013 and was not sure when and if to have a follow up colonoscopy. ? ? ?Medications: ?Outpatient Medications Prior to Visit  ?Medication Sig  ? acetaminophen (TYLENOL) 325 MG tablet Take 1,000 mg by mouth every 6 (six) hours as needed. (Patient not taking: Reported on 11/28/2021)  ? amLODipine (NORVASC) 2.5 MG tablet TAKE 1 TABLET DAILY (PLEASENOTE CHANGE TO 2.5MG       TABLETS)  ? levothyroxine (SYNTHROID) 25 MCG tablet Take 1 tablet (25 mcg total) by mouth daily before breakfast.  ? losartan (COZAAR) 50 MG tablet Take 1 tablet (50 mg total) by mouth daily.  ? Multiple Vitamin (MULTIVITAMIN WITH MINERALS) TABS tablet Take 1 tablet daily by mouth.  ? ?No facility-administered medications prior to visit.  ?  ?Allergies  ?Allergen Reactions  ? Indapamide   ?  Very low sodium =116 12/2019  ? Alpha-Gal   ?  (meat allergy from tick bite)  ? Verapamil   ?  Severe bradycardia  ? ? ?Patient Care Team: ?Birdie Sons, MD as PCP - General (Family Medicine) ?Eulogio Bear, MD as Consulting Physician (Ophthalmology) ?Ralene Bathe, MD as Consulting Physician (Dermatology) ? ?Review of Systems  ?HENT:  Positive for hearing loss.   ?Musculoskeletal:  Positive for gait problem.  ? ? ?  ? Objective  ?  ?Vitals: BP (!) 162/80 (BP Location: Right Arm, Patient Position: Sitting, Cuff Size: Normal)   Pulse (!) 59   Wt 151 lb 8 oz (68.7 kg)   SpO2 99%   BMI 22.37 kg/m?  ? ? ? ?General: Appearance:    Well developed, well nourished male in no acute distress  ?Eyes:    PERRL, conjunctiva/corneas clear, EOM's intact       ?Lungs:     Clear to auscultation bilaterally, respirations unlabored  ?Heart:    Bradycardic. Normal rhythm. No murmurs, rubs, or gallops.    ?MS:   All extremities are intact.    ?Neurologic:   Awake, alert, oriented x 3. No apparent focal neurological defect.   ?   ?  ? ?Most recent functional status assessment: ?   ? View : No data to display.  ?  ?  ?  ? ?Most recent fall risk assessment: ? ?  02/28/2021  ?  2:26 PM  ?Fall Risk   ?Falls in the past year? 1  ?Number falls in past yr: 0  ?Injury with Fall? 0  ?Follow up Falls prevention discussed  ? ? Most recent depression screenings: ? ?  02/28/2021  ?  2:17 PM 12/22/2020  ?  3:26 PM  ?PHQ 2/9 Scores  ?PHQ - 2 Score 0 0  ? ?Most recent cognitive screening: ?   ? View : No data to display.  ?  ?  ?  ? ?Most recent Audit-C alcohol use screening ? ?  02/28/2021  ?  2:16 PM  ?Alcohol Use Disorder Test (AUDIT)  ?1. How often do you have a drink containing alcohol? 4  ?2. How many drinks containing alcohol do you have on a typical day when you are drinking? 0  ?3. How often do you have six or more drinks on one occasion? 0  ?AUDIT-C Score 4  ?4. How often during the last year have you found that you were not able to stop drinking once you had started? 0  ?5. How often during the last year have you failed to do what was normally expected from you because of drinking? 0  ?6. How often during the last year have you needed a first drink in the morning to get yourself going after a heavy  drinking session? 0  ?7. How often during the last year have you had a feeling of guilt of remorse after drinking? 0  ?8. How often during the last year have you been unable to remember what happened the night before because you had been drinking? 0  ?9. Have you or someone else been injured as a result of your drinking? 0  ?10. Has a relative or friend or a doctor or another health worker been concerned about your drinking or suggested you cut down? 0  ?Alcohol Use Disorder Identification Test Final Score (AUDIT) 4  ?Alcohol Brief Interventions/Follow-up AUDIT Score <7 follow-up not indicated  ? ?A score of 3 or more in women, and 4 or more in men indicates increased risk for alcohol abuse, EXCEPT if all of the points are from question 1  ? ?No results found for any visits on 04/27/22. ? Assessment & Plan  ?  ? ?Annual wellness visit done today including the all of the following: ?Reviewed patient's Family Medical History ?Reviewed and updated list of patient's medical providers ?Assessment of cognitive impairment was done ?Assessed patient's functional ability ?Established a written schedule for health screening services ?Health Risk Assessent Completed and Reviewed ? ?Exercise Activities and Dietary recommendations ? Goals   ? ?  LIFESTYLE - DECREASE FALLS RISK   ?  Recommend to remove any items from the home that may cause slips or trips. ?  ? ?  ? ? ?Immunization History  ?Administered Date(s) Administered  ? Fluad Quad(high Dose 65+) 08/29/2021  ? Influenza, High Dose Seasonal PF 08/21/2017, 09/11/2018  ? Influenza-Unspecified 09/17/2015, 09/11/2018, 08/05/2020  ? PFIZER(Purple Top)SARS-COV-2 Vaccination 01/31/2020, 02/23/2020, 09/17/2020  ? Pneumococcal Conjugate-13 01/21/2015  ? Pneumococcal Polysaccharide-23 07/02/2001  ? Td 05/29/2004  ? Tdap 08/24/2011  ? Zoster Recombinat (Shingrix) 12/26/2018, 02/25/2019  ? Zoster, Live 11/24/2008  ? ? ?Health Maintenance  ?Topic Date Due  ? COLONOSCOPY (Pts 45-41yr  Insurance coverage will need to be confirmed)  04/07/2017  ? COVID-19 Vaccine (4 - Booster [place]) 11/12/2020  ? TETANUS/TDAP  08/23/2021  ? INFLUENZA VACCINE  07/17/2022  ? Pneumonia Vaccine 86 Years old  Completed  ? Zoster Vaccines- Shingrix  Completed  ? HPV VACCINES  Aged Out  ? ? ? ?Discussed health benefits of physical activity, and encouraged him to engage in regular exercise appropriate for his age and condition.  ?  ?  1. Primary hypertension ?Well controlled.  Continue current medications.   ? ?2. Hypothyroidism, unspecified type ? ?- TSH ?- T4, free ? ?3. Hyponatremia ? ?- Renal function panel ? ?4. Hearing loss due to cerumen impaction, left ?After soaking with Debrox, ear canal was irrigated with water until clear. Patient tolerated procedure well.   ? ?5. Hyperglycemia ? ?- Hemoglobin A1c  ? ?6. Colon cancer screening ?Discussed limited benefit of screening at his age, however he did have one adenomatous polyp 10 years ago and not had any screenings since, and is otherwise in very good health and may otherwise have 10-15 years of healthy life. We decided to do stool occult blood testing this year and sent with collection kit.  ?  ? ?The entirety of the information documented in the History of Present Illness, Review of Systems and Physical Exam were personally obtained by me. Portions of this information were initially documented by the CMA and reviewed by me for thoroughness and accuracy.   ? ? ?Lelon Huh, MD  ?[facility] ?[phone] (phone) ?[phone] (fax) ? ?[facility]  ? ?

## 2022-04-28 LAB — RENAL FUNCTION PANEL
Albumin: 4.3 g/dL (ref 3.6–4.6)
BUN/Creatinine Ratio: 26 — ABNORMAL HIGH (ref 10–24)
BUN: 21 mg/dL (ref 8–27)
CO2: 23 mmol/L (ref 20–29)
Calcium: 8.9 mg/dL (ref 8.6–10.2)
Chloride: 98 mmol/L (ref 96–106)
Creatinine, Ser: 0.81 mg/dL (ref 0.76–1.27)
Glucose: 97 mg/dL (ref 70–99)
Phosphorus: 3.2 mg/dL (ref 2.8–4.1)
Potassium: 4.2 mmol/L (ref 3.5–5.2)
Sodium: 135 mmol/L (ref 134–144)
eGFR: 85 mL/min/{1.73_m2} (ref >59)

## 2022-04-28 LAB — HEMOGLOBIN A1C
Est. average glucose Bld gHb Est-mCnc: 120 mg/dL
Hgb A1c MFr Bld: 5.8 % — ABNORMAL HIGH (ref 4.8–5.6)

## 2022-04-28 LAB — TSH: TSH: 2.58 u[IU]/mL (ref 0.450–4.500)

## 2022-04-28 LAB — T4, FREE: Free T4: 1.72 ng/dL (ref 0.82–1.77)

## 2022-05-02 ENCOUNTER — Other Ambulatory Visit: Payer: Self-pay | Admitting: Family Medicine

## 2022-05-02 ENCOUNTER — Other Ambulatory Visit: Payer: Self-pay

## 2022-05-02 DIAGNOSIS — Z1211 Encounter for screening for malignant neoplasm of colon: Secondary | ICD-10-CM

## 2022-05-02 LAB — IFOBT (OCCULT BLOOD): IFOBT: NEGATIVE

## 2022-05-02 NOTE — Progress Notes (Deleted)
error 

## 2022-05-31 ENCOUNTER — Other Ambulatory Visit: Payer: Self-pay | Admitting: Family Medicine

## 2022-05-31 DIAGNOSIS — I10 Essential (primary) hypertension: Secondary | ICD-10-CM

## 2022-08-02 DIAGNOSIS — H26492 Other secondary cataract, left eye: Secondary | ICD-10-CM | POA: Diagnosis not present

## 2022-11-02 ENCOUNTER — Encounter: Payer: Self-pay | Admitting: Family Medicine

## 2022-11-02 ENCOUNTER — Ambulatory Visit (INDEPENDENT_AMBULATORY_CARE_PROVIDER_SITE_OTHER): Payer: Medicare Other | Admitting: Family Medicine

## 2022-11-02 VITALS — BP 148/72 | HR 60 | Temp 98.2°F | Wt 160.0 lb

## 2022-11-02 DIAGNOSIS — E039 Hypothyroidism, unspecified: Secondary | ICD-10-CM

## 2022-11-02 DIAGNOSIS — I10 Essential (primary) hypertension: Secondary | ICD-10-CM | POA: Diagnosis not present

## 2022-11-02 DIAGNOSIS — R739 Hyperglycemia, unspecified: Secondary | ICD-10-CM | POA: Diagnosis not present

## 2022-11-02 NOTE — Patient Instructions (Signed)
.   Please review the attached list of medications and notify my office if there are any errors.   . Please bring all of your medications to every appointment so we can make sure that our medication list is the same as yours.   

## 2022-11-02 NOTE — Progress Notes (Signed)
I,Roshena L Chambers,acting as a scribe for Lelon Huh, MD.,have documented all relevant documentation on the behalf of Lelon Huh, MD,as directed by  Lelon Huh, MD while in the presence of Lelon Huh, MD.    Established patient visit   Patient: Travis Austin   DOB: 08-Dec-1931   88 y.o. Male  MRN: [id number] Visit Date: 11/02/2022  Today's healthcare provider: Lelon Huh, MD   Chief Complaint  Patient presents with   Hypertension   Prediabetes   Subjective    HPI  Hypertension, follow-up  BP Readings from Last 3 Encounters:  11/02/22 (!) 148/72  04/27/22 (!) 162/80  11/28/21 (!) 168/100   Wt Readings from Last 3 Encounters:  11/02/22 160 lb (72.6 kg)  04/27/22 151 lb 8 oz (68.7 kg)  11/28/21 158 lb 6 oz (71.8 kg)     He was last seen for hypertension 6 months ago.  BP at that visit was 162/80. Management since that visit includes continue same medication.  He reports good compliance with treatment. He is not having side effects.  He is following a Regular diet. He is not exercising. He does not smoke.  Use of agents associated with hypertension: thyroid hormones.   Outside blood pressures are less than 140/80. Symptoms: No chest pain No chest pressure  No palpitations No syncope  No dyspnea No orthopnea  No paroxysmal nocturnal dyspnea No lower extremity edema   Pertinent labs Lab Results  Component Value Date   CHOL 139 12/26/2018   HDL 47 12/26/2018   LDLCALC 75 12/26/2018   TRIG 84 12/26/2018   CHOLHDL 3.0 12/26/2018   Lab Results  Component Value Date   NA 135 04/27/2022   K 4.2 04/27/2022   CREATININE 0.81 04/27/2022   EGFR 85 04/27/2022   GLUCOSE 97 04/27/2022   TSH 2.580 04/27/2022     The ASCVD Risk score (Arnett DK, et al., 2019) failed to calculate for the following reasons:   The 2019 ASCVD risk score is only valid for ages 40 to  5  ---------------------------------------------------------------------------------------------------   Prediabetes, Follow-up  Lab Results  Component Value Date   HGBA1C 5.8 (H) 04/27/2022   GLUCOSE 97 04/27/2022   GLUCOSE 118 (H) 08/29/2021   GLUCOSE 131 (H) 05/29/2021    Last seen for for this 6 months ago.  Management since that visit includes continue lifestyle modifications. Current symptoms include none and have been stable.  Prior visit with dietician: no Current diet: well balanced Current exercise:  stationary bike  Pertinent Labs:    Component Value Date/Time   CHOL 139 12/26/2018 1509   TRIG 84 12/26/2018 1509   CHOLHDL 3.0 12/26/2018 1509   CREATININE 0.81 04/27/2022 1601    Wt Readings from Last 3 Encounters:  11/02/22 160 lb (72.6 kg)  04/27/22 151 lb 8 oz (68.7 kg)  11/28/21 158 lb 6 oz (71.8 kg)    -----------------------------------------------------------------------------------------     Medications: Outpatient Medications Prior to Visit  Medication Sig   acetaminophen (TYLENOL) 325 MG tablet Take 1,000 mg by mouth every 6 (six) hours as needed.   amLODipine (NORVASC) 2.5 MG tablet TAKE 1 TABLET DAILY (PLEASENOTE CHANGE TO 2.5MG       TABLETS)   losartan (COZAAR) 50 MG tablet TAKE 1 TABLET DAILY   Multiple Vitamin (MULTIVITAMIN WITH MINERALS) TABS tablet Take 1 tablet daily by mouth.   SYNTHROID 25 MCG tablet TAKE 1 TABLET DAILY BEFORE BREAKFAST   No facility-administered medications prior to visit.  Review of Systems  Constitutional:  Negative for appetite change, chills and fever.  Respiratory:  Negative for chest tightness, shortness of breath and wheezing.   Cardiovascular:  Negative for chest pain and palpitations.  Gastrointestinal:  Negative for abdominal pain, nausea and vomiting.       Objective    BP (!) 148/72 (BP Location: Right Arm, Patient Position: Sitting, Cuff Size: Normal)   Pulse 60   Temp 98.2 F (36.8 C)  (Oral)   Wt 160 lb (72.6 kg)   SpO2 99% Comment: room air  BMI 23.63 kg/m    Today's Vitals   11/02/22 1312 11/02/22 1319  BP: (!) 165/73 (!) 148/72  Pulse: 67 60  Temp: 98.2 F (36.8 C)   TempSrc: Oral   SpO2: 99%   Weight: 160 lb (72.6 kg)    Body mass index is 23.63 kg/m.   Physical Exam   General appearance: Well developed, well nourished male, cooperative and in no acute distress Head: Normocephalic, without obvious abnormality, atraumatic Respiratory: Respirations even and unlabored, normal respiratory rate Extremities: All extremities are intact.  Skin: Skin color, texture, turgor normal. No rashes seen  Psych: Appropriate mood and affect. Neurologic: Mental status: Alert, oriented to person, place, and time, thought content appropriate.    Assessment & Plan     1. Primary hypertension Home BP much better than office readings. Continue current medications.   - Basic metabolic panel  2. Hyperglycemia  - Hemoglobin A1c  3. Hypothyroidism, unspecified type Clinically euthyroid - TSH      The entirety of the information documented in the History of Present Illness, Review of Systems and Physical Exam were personally obtained by me. Portions of this information were initially documented by the CMA and reviewed by me for thoroughness and accuracy.     Lelon Huh, MD  [facility] [phone] (phone) [phone] (fax)  [place]

## 2022-11-03 LAB — BASIC METABOLIC PANEL
BUN/Creatinine Ratio: 31 — ABNORMAL HIGH (ref 10–24)
BUN: 27 mg/dL (ref 8–27)
CO2: 22 mmol/L (ref 20–29)
Calcium: 9.3 mg/dL (ref 8.6–10.2)
Chloride: 99 mmol/L (ref 96–106)
Creatinine, Ser: 0.87 mg/dL (ref 0.76–1.27)
Glucose: 100 mg/dL — ABNORMAL HIGH (ref 70–99)
Potassium: 4.6 mmol/L (ref 3.5–5.2)
Sodium: 134 mmol/L (ref 134–144)
eGFR: 82 mL/min/{1.73_m2} (ref >59)

## 2022-11-03 LAB — TSH: TSH: 2.77 u[IU]/mL (ref 0.450–4.500)

## 2022-11-03 LAB — HEMOGLOBIN A1C
Est. average glucose Bld gHb Est-mCnc: 123 mg/dL
Hgb A1c MFr Bld: 5.9 % — ABNORMAL HIGH (ref 4.8–5.6)

## 2022-11-07 ENCOUNTER — Encounter: Payer: Self-pay | Admitting: *Deleted

## 2023-03-04 ENCOUNTER — Other Ambulatory Visit: Payer: Self-pay | Admitting: Family Medicine

## 2023-03-04 DIAGNOSIS — I10 Essential (primary) hypertension: Secondary | ICD-10-CM

## 2023-04-14 ENCOUNTER — Other Ambulatory Visit: Payer: Self-pay | Admitting: Family Medicine

## 2023-05-06 ENCOUNTER — Ambulatory Visit (INDEPENDENT_AMBULATORY_CARE_PROVIDER_SITE_OTHER): Payer: Medicare Other | Admitting: Family Medicine

## 2023-05-06 ENCOUNTER — Other Ambulatory Visit: Payer: Self-pay | Admitting: Family Medicine

## 2023-05-06 ENCOUNTER — Encounter: Payer: Self-pay | Admitting: Family Medicine

## 2023-05-06 VITALS — BP 176/81 | HR 55 | Ht 68.5 in | Wt 163.0 lb

## 2023-05-06 DIAGNOSIS — I10 Essential (primary) hypertension: Secondary | ICD-10-CM | POA: Diagnosis not present

## 2023-05-06 DIAGNOSIS — I493 Ventricular premature depolarization: Secondary | ICD-10-CM | POA: Diagnosis not present

## 2023-05-06 DIAGNOSIS — E871 Hypo-osmolality and hyponatremia: Secondary | ICD-10-CM

## 2023-05-06 DIAGNOSIS — H903 Sensorineural hearing loss, bilateral: Secondary | ICD-10-CM

## 2023-05-06 DIAGNOSIS — Z0000 Encounter for general adult medical examination without abnormal findings: Secondary | ICD-10-CM

## 2023-05-06 DIAGNOSIS — E039 Hypothyroidism, unspecified: Secondary | ICD-10-CM

## 2023-05-06 DIAGNOSIS — R739 Hyperglycemia, unspecified: Secondary | ICD-10-CM

## 2023-05-06 NOTE — Progress Notes (Signed)
Annual Wellness Visit     Patient: Travis Austin, Male    DOB: 02/06/1931, 89 y.o.   MRN: [id number] Visit Date: 05/06/2023  Today's Provider: Mila Merry, MD   Chief Complaint  Patient presents with   Medicare Wellness   Subjective    Travis Austin is a 89 y.o. male who presents today for his Annual Wellness Visit. He reports consuming a  regular  diet.  He generally feels well. He reports sleeping fairly well. Is having a little more trouble with general strength and balance, but no falls.  He does not have additional problems to discuss today.   He is also due to follow up hypertension, hypothyroid, and chronic hyponatremia. Feels well on current medications.     Medications: Outpatient Medications Prior to Visit  Medication Sig   acetaminophen (TYLENOL) 325 MG tablet Take 1,000 mg by mouth every 6 (six) hours as needed.   amLODipine (NORVASC) 2.5 MG tablet TAKE 1 TABLET DAILY (PLEASENOTE CHANGE TO 2.5MG        TABLETS)   losartan (COZAAR) 50 MG tablet TAKE 1 TABLET DAILY   Multiple Vitamin (MULTIVITAMIN WITH MINERALS) TABS tablet Take 1 tablet daily by mouth.   SYNTHROID 25 MCG tablet TAKE 1 TABLET DAILY BEFORE BREAKFAST   No facility-administered medications prior to visit.    Allergies  Allergen Reactions   Indapamide     Very low sodium =116 12/2019   Alpha-Gal     (meat allergy from tick bite)   Verapamil     Severe bradycardia    Patient Care Team: Malva Limes, MD as PCP - General (Family Medicine) [place] Crane, MD as Consulting Physician (Ophthalmology) Deirdre Evener, MD as Consulting Physician (Dermatology)  Review of Systems  Constitutional:  Negative for appetite change, chills and fever.  Respiratory:  Negative for chest tightness, shortness of breath and wheezing.   Cardiovascular:  Negative for chest pain and palpitations.  Gastrointestinal:  Negative for abdominal pain, nausea and vomiting.        Objective    Vitals:  BP (!) 176/81   Pulse (!) 55   Ht 5' 8.5" (1.74 m)   Wt 163 lb (73.9 kg)   SpO2 96%   BMI 24.42 kg/m     Physical Exam  General Appearance:    Well developed, well nourished male. Alert, cooperative, in no acute distress, appears stated age  Head:    Normocephalic, without obvious abnormality, atraumatic  Eyes:    PERRL, conjunctiva/corneas clear, EOM's intact, fundi    benign, both eyes       Ears:    Excessive cerumen right ear canal. Very HOH.   Nose:   Nares normal, septum midline, mucosa normal, no drainage   or sinus tenderness  Throat:   Lips, mucosa, and tongue normal; teeth and gums normal  Neck:   Supple, symmetrical, trachea midline, no adenopathy;       thyroid:  No enlargement/tenderness/nodules; no carotid   bruit or JVD  Lungs:     Clear to auscultation bilaterally, respirations unlabored  Chest wall:    No tenderness or deformity  Heart:    Bradycardic. Normal rhythm. No murmurs, rubs, or gallops.  S1 and S2 normal  Abdomen:     Soft, non-tender, bowel sounds active all four quadrants,    no masses, no organomegaly  Extremities:   All extremities are intact. No cyanosis or edema  Pulses:   2+ and symmetric all  extremities  Skin:   Skin color, texture, turgor normal, no rashes or lesions  Neurologic:   CNII-XII intact. Normal strength, sensation and reflexes      throughout     Most recent functional status assessment:    05/06/2023    1:58 PM  In your present state of health, do you have any difficulty performing the following activities:  Hearing? 1  Vision? 0  Difficulty concentrating or making decisions? 0  Walking or climbing stairs? 1  Dressing or bathing? 0  Doing errands, shopping? 0  Preparing Food and eating ? N  Using the Toilet? N  In the past six months, have you accidently leaked urine? N  Do you have problems with loss of bowel control? N  Managing your Medications? N  Managing your Finances? N  Housekeeping or managing your  Housekeeping? N   Most recent fall risk assessment:    05/06/2023    1:58 PM  Fall Risk   Falls in the past year? 0  Number falls in past yr: 0  Injury with Fall? 0    Most recent depression screenings:    05/06/2023    1:58 PM 04/27/2022    2:16 PM  PHQ 2/9 Scores  PHQ - 2 Score 0 0  PHQ- 9 Score  0   Most recent cognitive screening:    05/06/2023    2:05 PM  6CIT Screen  What Year? 0 points  What month? 0 points  What time? 0 points  Count back from 20 0 points  Months in reverse 0 points  Repeat phrase 2 points  Total Score 2 points   Most recent Audit-C alcohol use screening    04/27/2022    2:16 PM  Alcohol Use Disorder Test (AUDIT)  1. How often do you have a drink containing alcohol? 4  2. How many drinks containing alcohol do you have on a typical day when you are drinking? 0  3. How often do you have six or more drinks on one occasion? 0  AUDIT-C Score 4   A score of 3 or more in women, and 4 or more in men indicates increased risk for alcohol abuse, EXCEPT if all of the points are from question 1   No results found for any visits on 05/06/23.  Assessment & Plan     Annual wellness visit done today including the all of the following: Reviewed patient's Family Medical History Reviewed and updated list of patient's medical providers Assessment of cognitive impairment was done Assessed patient's functional ability Established a written schedule for health screening services Health Risk Assessent Completed and Reviewed  Exercise Activities and Dietary recommendations  Goals      LIFESTYLE - DECREASE FALLS RISK     Recommend to remove any items from the home that may cause slips or trips.        Immunization History  Administered Date(s) Administered   Fluad Quad(high Dose 65+) 08/29/2021   Influenza, High Dose Seasonal PF 08/21/2017, 09/11/2018, 09/30/2022   Influenza-Unspecified 09/17/2015, 09/11/2018, 08/05/2020   PFIZER(Purple Top)SARS-COV-2  Vaccination 01/31/2020, 02/23/2020, 09/17/2020   Pfizer Covid-19 Vaccine Bivalent Booster 57yrs & up 09/18/2022, 04/01/2023   Pneumococcal Conjugate-13 01/21/2015   Pneumococcal Polysaccharide-23 07/02/2001   Td 05/29/2004   Tdap 08/24/2011   Zoster Recombinat (Shingrix) 12/26/2018, 02/25/2019   Zoster, Live 11/24/2008    Health Maintenance  Topic Date Due   DTaP/Tdap/Td (3 - Td or Tdap) 08/23/2021   COVID-19 Vaccine (6 - 2023-24  season) 05/27/2023   INFLUENZA VACCINE  07/18/2023   Medicare Annual Wellness (AWV)  05/05/2024   Pneumonia Vaccine 57+ Years old  Completed   Zoster Vaccines- Shingrix  Completed   HPV VACCINES  Aged Out   COLONOSCOPY (Pts 45-27yrs Insurance coverage will need to be confirmed)  Discontinued     Discussed health benefits of physical activity, and encouraged him to engage in regular exercise appropriate for his age and condition.      2. Primary hypertension Home bp readings much better than office readings.  - Comprehensive metabolic panel - EKG 12-Lead  3. Frequent unifocal PVCs Asymptomatic. No longer followed by cardiology.  - Magnesium  4. Hypothyroidism, unspecified type  - TSH - T4, free  5. Hyponatremia   6. Hyperglycemia  - Hemoglobin A1c   7. Hard of hearing Wears hearing aides which have not been working well lately. He plans on schedule appt with ENT for evaluation and will likely need cerumen extraction from right ear canal       Mila Merry, MD  [facility] [phone] (phone) [phone] (fax)  [facility]

## 2023-05-07 ENCOUNTER — Encounter: Payer: Self-pay | Admitting: Family Medicine

## 2023-05-07 ENCOUNTER — Other Ambulatory Visit: Payer: Self-pay | Admitting: Family Medicine

## 2023-05-07 DIAGNOSIS — I10 Essential (primary) hypertension: Secondary | ICD-10-CM

## 2023-05-07 LAB — COMPREHENSIVE METABOLIC PANEL
ALT: 14 IU/L (ref 0–44)
AST: 17 IU/L (ref 0–40)
Albumin/Globulin Ratio: 1.9 (ref 1.2–2.2)
Albumin: 4.4 g/dL (ref 3.7–4.7)
Alkaline Phosphatase: 115 IU/L (ref 44–121)
BUN/Creatinine Ratio: 23 (ref 10–24)
BUN: 22 mg/dL (ref 8–27)
Bilirubin Total: 0.4 mg/dL (ref 0.0–1.2)
CO2: 22 mmol/L (ref 20–29)
Calcium: 9.3 mg/dL (ref 8.6–10.2)
Chloride: 97 mmol/L (ref 96–106)
Creatinine, Ser: 0.95 mg/dL (ref 0.76–1.27)
Globulin, Total: 2.3 g/dL (ref 1.5–4.5)
Glucose: 102 mg/dL — ABNORMAL HIGH (ref 70–99)
Potassium: 4.4 mmol/L (ref 3.5–5.2)
Sodium: 133 mmol/L — ABNORMAL LOW (ref 134–144)
Total Protein: 6.7 g/dL (ref 6.0–8.5)
eGFR: 77 mL/min/{1.73_m2} (ref >59)

## 2023-05-07 LAB — HEMOGLOBIN A1C
Est. average glucose Bld gHb Est-mCnc: 128 mg/dL
Hgb A1c MFr Bld: 6.1 % — ABNORMAL HIGH (ref 4.8–5.6)

## 2023-05-07 LAB — TSH: TSH: 3.76 u[IU]/mL (ref 0.450–4.500)

## 2023-05-07 LAB — MAGNESIUM: Magnesium: 2.1 mg/dL (ref 1.6–2.3)

## 2023-05-07 LAB — T4, FREE: Free T4: 1.62 ng/dL (ref 0.82–1.77)

## 2023-05-07 NOTE — Telephone Encounter (Signed)
Requested Prescriptions  Pending Prescriptions Disp Refills   amLODipine (NORVASC) 2.5 MG tablet [Pharmacy Med Name: AMLODIPINE TAB 2.5MG ] 60 tablet 0    Sig: TAKE 1 TABLET DAILY (PLEASENOTE CHANGE TO 2.5MG        TABLETS)     Cardiovascular: Calcium Channel Blockers 2 Failed - 05/07/2023  2:14 AM      Failed - Last BP in normal range    BP Readings from Last 1 Encounters:  05/06/23 (!) 176/81         Passed - Last Heart Rate in normal range    Pulse Readings from Last 1 Encounters:  05/06/23 (!) 55         Passed - Valid encounter within last 6 months    Recent Outpatient Visits           Yesterday Medicare annual wellness visit, subsequent   [facility] Malva Limes, MD   6 months ago Primary hypertension   [facility] [facility] Malva Limes, MD   1 year ago Primary hypertension   [place] [facility] Malva Limes, MD   1 year ago Primary hypertension   [facility] Malva Limes, MD   1 year ago Primary hypertension   [place] [facility] Malva Limes, MD       Future Appointments             In 6 months Fisher, Demetrios Isaacs, MD [facility]

## 2023-05-09 ENCOUNTER — Encounter: Payer: Self-pay | Admitting: Family Medicine

## 2023-05-26 ENCOUNTER — Other Ambulatory Visit: Payer: Self-pay | Admitting: Family Medicine

## 2023-05-26 DIAGNOSIS — I10 Essential (primary) hypertension: Secondary | ICD-10-CM

## 2023-05-27 NOTE — Telephone Encounter (Signed)
Requested Prescriptions  Pending Prescriptions Disp Refills   losartan (COZAAR) 50 MG tablet [Pharmacy Med Name: LOSARTAN TAB 50MG ] 90 tablet 3    Sig: TAKE 1 TABLET DAILY     Cardiovascular:  Angiotensin Receptor Blockers Failed - 05/26/2023  2:26 AM      Failed - Last BP in normal range    BP Readings from Last 1 Encounters:  05/06/23 (!) 176/81         Passed - Cr in normal range and within 180 days    Creatinine, Ser  Date Value Ref Range Status  05/06/2023 0.95 0.76 - 1.27 mg/dL Final         Passed - K in normal range and within 180 days    Potassium  Date Value Ref Range Status  05/06/2023 4.4 3.5 - 5.2 mmol/L Final         Passed - Patient is not pregnant      Passed - Valid encounter within last 6 months    Recent Outpatient Visits           3 weeks ago Medicare annual wellness visit, subsequent   [facility] Malva Limes, MD   6 months ago Primary hypertension   [facility] Malva Limes, MD   1 year ago Primary hypertension   [place] Malva Limes, MD   1 year ago Primary hypertension   [place] [facility] Malva Limes, MD   1 year ago Primary hypertension    [facility] Malva Limes, MD       Future Appointments             In 5 months Fisher, Demetrios Isaacs, MD [facility]

## 2023-06-12 ENCOUNTER — Encounter: Payer: Self-pay | Admitting: Family Medicine

## 2023-06-12 DIAGNOSIS — H9113 Presbycusis, bilateral: Secondary | ICD-10-CM

## 2023-07-06 ENCOUNTER — Other Ambulatory Visit: Payer: Self-pay | Admitting: Family Medicine

## 2023-07-06 DIAGNOSIS — I10 Essential (primary) hypertension: Secondary | ICD-10-CM

## 2023-09-12 ENCOUNTER — Ambulatory Visit: Payer: Medicare Other | Admitting: Physician Assistant

## 2023-09-25 ENCOUNTER — Encounter: Payer: Self-pay | Admitting: Family Medicine

## 2023-09-26 ENCOUNTER — Encounter: Payer: Self-pay | Admitting: Family Medicine

## 2023-09-27 NOTE — Telephone Encounter (Signed)
Patient can have SameDay slot next week to evaluate irregular heart rate

## 2023-10-02 ENCOUNTER — Ambulatory Visit: Payer: Medicare Other | Attending: Family Medicine

## 2023-10-02 ENCOUNTER — Encounter: Payer: Self-pay | Admitting: Family Medicine

## 2023-10-02 ENCOUNTER — Ambulatory Visit (INDEPENDENT_AMBULATORY_CARE_PROVIDER_SITE_OTHER): Payer: Medicare Other | Admitting: Family Medicine

## 2023-10-02 VITALS — BP 168/70 | HR 75 | Temp 98.0°F | Ht 68.5 in | Wt 156.0 lb

## 2023-10-02 DIAGNOSIS — R002 Palpitations: Secondary | ICD-10-CM | POA: Diagnosis not present

## 2023-10-02 DIAGNOSIS — I499 Cardiac arrhythmia, unspecified: Secondary | ICD-10-CM

## 2023-10-02 DIAGNOSIS — I10 Essential (primary) hypertension: Secondary | ICD-10-CM | POA: Diagnosis not present

## 2023-10-02 MED ORDER — LOSARTAN POTASSIUM 100 MG PO TABS
100.0000 mg | ORAL_TABLET | Freq: Every day | ORAL | 1 refills | Status: DC
Start: 2023-10-02 — End: 2023-12-23

## 2023-10-02 NOTE — Progress Notes (Signed)
Established patient visit   Patient: Travis Austin   DOB: Feb 19, 1931   89 y.o. Male  MRN: [id number] Visit Date: 10/02/2023  Today's healthcare provider: Mila Merry, MD   Chief Complaint  Patient presents with   Palpitations    Patient reports that according to his blood pressure monitor at home he has been having an irrecgular heart beat.  He states he has no symptoms and does not feel any different than his normal but when he checks his pulse he does feel the heart beating irregularly. Per his history on file he has had history of frequent PVCs on EKG.   Subjective    Discussed the use of AI scribe software for clinical note transcription with the patient, who gave verbal consent to proceed.  History of Present Illness   The patient, with a history of hypertension, presented with concerns about irregular readings on his home blood pressure monitor. He reported that the monitor, which is approximately three to four years old, has been indicating irregular heart rhythms. The patient has been able to feel these irregularities himself, describing them as very irregular and different in length. He expressed concern about a potential worsening of his condition. He has long history of frequent PVCs on prior EKGS and referred to Dr. Kirke Corin a few years, but he declined home monitor at that time.   Despite these irregularities, the patient reported feeling well overall, with no complaints of dizziness or other symptoms. He has been monitoring his blood pressure at home, following the recommended procedures such as relaxing for 15 minutes prior to taking a reading and ensuring his arm is at heart level. However, he has noticed variations in his readings throughout the day and has questioned the reliability of his monitor, as it often indicates a lower pulse rate than what he perceives. He has had trouble with edema on higher doses of amlodipine and hyponatremia in the past when he was on  thiazide diuretics.       Medications: Outpatient Medications Prior to Visit  Medication Sig   acetaminophen (TYLENOL) 325 MG tablet Take 1,000 mg by mouth every 6 (six) hours as needed.   amLODipine (NORVASC) 2.5 MG tablet Take 1 tablet (2.5 mg total) by mouth daily.   Multiple Vitamin (MULTIVITAMIN WITH MINERALS) TABS tablet Take 1 tablet daily by mouth.   SYNTHROID 25 MCG tablet TAKE 1 TABLET DAILY BEFORE BREAKFAST   [DISCONTINUED] losartan (COZAAR) 50 MG tablet TAKE 1 TABLET DAILY   No facility-administered medications prior to visit.   Review of Systems     Objective    BP (!) 168/70 Comment: manual cuff  Pulse 75   Temp 98 F (36.7 C) (Oral)   Ht 5' 8.5" (1.74 m)   Wt 156 lb (70.8 kg)   SpO2 99%   BMI 23.37 kg/m   Physical Exam   General: Appearance:    Well developed, well nourished male in no acute distress  Eyes:    PERRL, conjunctiva/corneas clear, EOM's intact       Lungs:     Clear to auscultation bilaterally, respirations unlabored  Heart:    Normal heart rate. Frequent premature beats.  No murmurs, rubs, or gallops.    MS:   All extremities are intact.    Neurologic:   Awake, alert, oriented x 3. No apparent focal neurological defect.       EKG: Atrial bigeminy.   Assessment & Plan  Cardiac Arrhythmia Patient reports irregular heartbeats detected by home blood pressure monitor. Patient also reports feeling the irregularity. Long history of frequent PVCs but appears to be having PAC after every normal contraction today. Can try OTC magnesium oxide supplements.  -Order Zio monitor for continuous heart rhythm monitoring for one week. -Consider verapamil or diltiazem.   Hypertension Manual blood pressure measurement in office was 170/68, which is elevated compared to patient's usual home readings. -increase losartan to 100mg          Mila Merry, MD  [facility] [phone] (phone) [phone] (fax)  [facility]

## 2023-10-08 DIAGNOSIS — R002 Palpitations: Secondary | ICD-10-CM

## 2023-10-08 DIAGNOSIS — I499 Cardiac arrhythmia, unspecified: Secondary | ICD-10-CM | POA: Diagnosis not present

## 2023-10-31 NOTE — Progress Notes (Addendum)
Cardiology Clinic Note   Date: 11/01/2023 ID: BRECCAN LAMPMAN, DOB 14-Feb-1931, MRN [id number]  Primary Cardiologist:  Lorine Bears, MD  Patient Profile    Travis Austin is a 89 y.o. male who presents to the clinic today for evaluation of irregular heartbeat.     Past medical history significant for: PACs. 7-day ZIO 10/23/2023: HR 44 to 160 bpm, average 61 bpm.  17 episodes of SVT, longest 47.4 seconds with average rate 119 bpm.  Frequent supraventricular ectopy 16%.  Occasional ventricular ectopy 2.1%.  No A-fib detected. RBBB. Hypertension. Hypothyroidism.  In summary, patient was first seen by Dr. Kirke Corin on 06/01/2021 for an episode of asymptomatic bradycardia.  Patient was seen in the ED for bradycardia that he noted while checking his pulse ox.  Heart rate in the 30s to 40s.  Heart rate in the ED was 37 bpm.  He was noted to have frequent PACs.  Verapamil was discontinued.  His workup was otherwise unremarkable.  At the time of his visit he was no longer bradycardic.  He was hypertensive at the time of his visit amlodipine was added.     History of Present Illness    Travis Austin was seen by Dr Kirke Corin in 2022 for bradycardia as outlined above.  Patient was last seen in the office by Dr. Kirke Corin on 11/28/2021 for routine follow-up.  He was doing well at that time.  His BP was elevated and attributed to whitecoat syndrome.  No medication changes were made.  Patient was instructed to follow-up as needed.  Patient was seen by his PCP on 10/02/2023 with complaints of irregular heartbeat noted on home BP monitor.  He denied any symptoms but did not feel his heart was beating irregularly when checking his pulse.  He was placed on a 7-day ZIO which showed 17 runs of SVT longest 47.4 seconds and frequent supraventricular ectopy (16%).  Discussed the use of AI scribe software for clinical note transcription with the patient, who gave verbal consent to proceed.  The patient presents for  evaluation of irregular heartbeat. He only become aware of the irregularity when using his blood pressure monitor or when deliberately trying to feel his pulse. He has no cardiac awareness of the fast runs or the PACs. Patient denies shortness of breath, dyspnea on exertion, orthopnea or PND. No chest pain, pressure, or tightness. Patient has chronic lower extremity edema that is at its best in the morning and progresses throughout the day.   The patient is very active, engaging in regular indoor cycling for 30 minutes every other day. They report no change in their tolerance for this activity.        ROS: All other systems reviewed and are otherwise negative except as noted in History of Present Illness.  Studies Reviewed    EKG Interpretation Date/Time:  Friday November 01 2023 13:20:00 EST Ventricular Rate:  59 PR Interval:  160 QRS Duration:  150 QT Interval:  430 QTC Calculation: 425 R Axis:   11  Text Interpretation: Sinus bradycardia with occasional Premature ventricular complexes and Premature atrial complexes Right bundle branch block When compared with ECG of 10/02/2023 (not in [facility]) showed bigeminal PACs and RBBB Reconfirmed by Travis Austin [phone]) on 11/01/2023 1:32:33 PM   Risk Assessment/Calculations      HYPERTENSION CONTROL Vitals:   11/01/23 1315 11/01/23 1519  BP: (!) 158/60 (!) 140/70    The patient's blood pressure is elevated above target today.  In order to  address the patient's elevated BP: The blood pressure is usually elevated in clinic.  Blood pressures monitored at home have been optimal.           Physical Exam    VS:  BP (!) 158/60 (BP Location: Left Arm, Patient Position: Sitting, Cuff Size: Normal)   Pulse (!) 59   Ht 5' 8.5" (1.74 m)   Wt 154 lb 8 oz (70.1 kg)   SpO2 97%   BMI 23.15 kg/m  , BMI Body mass index is 23.15 kg/m.  GEN: Well nourished, well developed, in no acute distress. Neck: No JVD or carotid bruits. Cardiac:  RRR.  Extrasystole. No murmurs. No rubs or gallops.   Respiratory:  Respirations regular and unlabored. Clear to auscultation without rales, wheezing or rhonchi. GI: Soft, nontender, nondistended. Extremities: Radials/DP/PT 2+ and equal bilaterally. No clubbing or cyanosis. No edema.  Skin: Warm and dry, no rash. Neuro: Strength intact.  Assessment & Plan      PACs 7 day Zio November 2024 showed frequent supraventricular ectopy (16%) and 17 runs of SVT, longest 47.4 seconds. Patient denies cardiac awareness of fast runs or PACs. He denies shortness of breath, dizziness, lightheadedness, presyncope or syncope. He remains very active doing indoor cycling for 30 minutes every other day.   -Schedule echo.  -Given patient is asymptomatic, no other changes today.   Hypertension BP today 158/60 on intake and 140/70 on my recheck. He reports BP is always high at the doctor's office. Home BP consistently in the 130s/70s.    -Continue Losartan and amlodipine.  Chronic Lower Extremity Edema Mild daily leg swelling, improved from previous severe edema while on higher dose of Amlodipine. Mild, nonpitting edema bilateral lower extremities. No DOE, orthopnea or PND.  -Continue compression socks.         Disposition: Echo. Return in 2 months or sooner as needed.          Signed, Travis Grandchild. Sharlotte Baka, DNP, NP-C

## 2023-11-01 ENCOUNTER — Encounter: Payer: Self-pay | Admitting: Student

## 2023-11-01 ENCOUNTER — Ambulatory Visit: Payer: Medicare Other | Attending: Student | Admitting: Student

## 2023-11-01 VITALS — BP 140/70 | HR 59 | Ht 68.5 in | Wt 154.5 lb

## 2023-11-01 DIAGNOSIS — R600 Localized edema: Secondary | ICD-10-CM | POA: Diagnosis present

## 2023-11-01 DIAGNOSIS — I10 Essential (primary) hypertension: Secondary | ICD-10-CM | POA: Diagnosis present

## 2023-11-01 DIAGNOSIS — I491 Atrial premature depolarization: Secondary | ICD-10-CM | POA: Diagnosis present

## 2023-11-01 DIAGNOSIS — I499 Cardiac arrhythmia, unspecified: Secondary | ICD-10-CM | POA: Diagnosis present

## 2023-11-01 NOTE — Patient Instructions (Signed)
Medication Instructions:  No changes *If you need a refill on your cardiac medications before your next appointment, please call your pharmacy*   Lab Work: None ordered If you have labs (blood work) drawn today and your tests are completely normal, you will receive your results only by: MyChart Message (if you have MyChart) OR A paper copy in the mail If you have any lab test that is abnormal or we need to change your treatment, we will call you to review the results.   Testing/Procedures: Your physician has requested that you have an echocardiogram. Echocardiography is a painless test that uses sound waves to create images of your heart. It provides your doctor with information about the size and shape of your heart and how well your heart's chambers and valves are working.   You may receive an ultrasound enhancing agent through an IV if needed to better visualize your heart during the echo. This procedure takes approximately one hour.  There are no restrictions for this procedure.  This will take place at [address] [facility], [place] [address]  Please note: We ask at that you not bring children with you during ultrasound (echo/ vascular) testing. Due to room size and safety concerns, children are not allowed in the ultrasound rooms during exams. Our front office staff cannot provide observation of children in our lobby area while testing is being conducted. An adult accompanying a patient to their appointment will only be allowed in the ultrasound room at the discretion of the ultrasound technician under special circumstances. We apologize for any inconvenience.    Follow-Up: At [facility], you and your health needs are our priority.  As part of our continuing mission to provide you with exceptional heart care, we have created designated Provider Care Teams.  These Care Teams include your primary Cardiologist (physician) and Advanced Practice  Providers (APPs -  Physician Assistants and Nurse Practitioners) who all work together to provide you with the care you need, when you need it.  We recommend signing up for the patient portal called "MyChart".  Sign up information is provided on this After Visit Summary.  MyChart is used to connect with patients for Virtual Visits (Telemedicine).  Patients are able to view lab/test results, encounter notes, upcoming appointments, etc.  Non-urgent messages can be sent to your provider as well.   To learn more about what you can do with MyChart, go to [email or web address].    Your next appointment:   2 month(s)  Provider:   You may see Lorine Bears, MD or one of the following Advanced Practice Providers on your designated Care Team:   Carlos Levering, NP

## 2023-11-07 ENCOUNTER — Other Ambulatory Visit: Payer: Self-pay | Admitting: Student

## 2023-11-07 DIAGNOSIS — I499 Cardiac arrhythmia, unspecified: Secondary | ICD-10-CM

## 2023-11-07 DIAGNOSIS — I10 Essential (primary) hypertension: Secondary | ICD-10-CM

## 2023-11-07 DIAGNOSIS — R600 Localized edema: Secondary | ICD-10-CM

## 2023-11-07 DIAGNOSIS — I491 Atrial premature depolarization: Secondary | ICD-10-CM

## 2023-11-11 ENCOUNTER — Encounter: Payer: Self-pay | Admitting: Family Medicine

## 2023-11-11 ENCOUNTER — Ambulatory Visit (INDEPENDENT_AMBULATORY_CARE_PROVIDER_SITE_OTHER): Payer: Medicare Other | Admitting: Family Medicine

## 2023-11-11 VITALS — BP 166/83 | HR 65 | Resp 16 | Ht 68.5 in | Wt 153.3 lb

## 2023-11-11 DIAGNOSIS — E039 Hypothyroidism, unspecified: Secondary | ICD-10-CM

## 2023-11-11 DIAGNOSIS — I10 Essential (primary) hypertension: Secondary | ICD-10-CM | POA: Diagnosis not present

## 2023-11-11 DIAGNOSIS — R739 Hyperglycemia, unspecified: Secondary | ICD-10-CM | POA: Diagnosis not present

## 2023-11-11 DIAGNOSIS — E871 Hypo-osmolality and hyponatremia: Secondary | ICD-10-CM | POA: Diagnosis not present

## 2023-11-11 DIAGNOSIS — I471 Supraventricular tachycardia, unspecified: Secondary | ICD-10-CM | POA: Insufficient documentation

## 2023-11-11 NOTE — Progress Notes (Unsigned)
      Established patient visit   Patient: Travis Austin   DOB: [birth year]   89 y.o. Male  MRN: [id number] Visit Date: 11/11/2023  Today's healthcare provider: Mila Merry, MD   Chief Complaint  Patient presents with   Medical Management of Chronic Issues   Subjective    HPI  Presents for follow up hypertension and hyponatremia. Was noted on last visit in October to be in atrial bigeminy. Has since had zio monitor with many brief episodes of SVT. Had evaluation by cardiology on the 15th. No med changes were made, echo was ordered.   He reports home SBP consistently in the 130s and diastolic consistently in the 60-70s. Generally feels well.  Lab Results  Component Value Date   NA 133 (L) 05/06/2023   K 4.4 05/06/2023   CREATININE 0.95 05/06/2023   EGFR 77 05/06/2023   GLUCOSE 102 (H) 05/06/2023   Lab Results  Component Value Date   MG 2.1 05/06/2023     Medications: Outpatient Medications Prior to Visit  Medication Sig   acetaminophen (TYLENOL) 325 MG tablet Take 1,000 mg by mouth every 6 (six) hours as needed.   amLODipine (NORVASC) 2.5 MG tablet Take 1 tablet (2.5 mg total) by mouth daily.   losartan (COZAAR) 100 MG tablet Take 1 tablet (100 mg total) by mouth daily.   Multiple Vitamin (MULTIVITAMIN WITH MINERALS) TABS tablet Take 1 tablet daily by mouth.   SYNTHROID 25 MCG tablet TAKE 1 TABLET DAILY BEFORE BREAKFAST   No facility-administered medications prior to visit.    Review of Systems  Constitutional:  Negative for appetite change, chills and fever.  Respiratory:  Negative for chest tightness, shortness of breath and wheezing.   Cardiovascular:  Negative for chest pain and palpitations.  Gastrointestinal:  Negative for abdominal pain, nausea and vomiting.   {Insert previous labs (optional):23779} {See past labs  Heme  Chem  Endocrine  Serology  Results Review (optional):1}   Objective    BP (!) 166/83 (BP Location: Left Arm, Patient Position:  Sitting, Cuff Size: Normal)   Pulse 65   Resp 16   Ht 5' 8.5" (1.74 m)   Wt 153 lb 4.8 oz (69.5 kg)   SpO2 98%   BMI 22.97 kg/m  {Insert last BP/Wt (optional):23777}{See vitals history (optional):1}  Physical Exam   General: Appearance:    Well developed, well nourished male in no acute distress  Eyes:    PERRL, conjunctiva/corneas clear, EOM's intact       Lungs:     Clear to auscultation bilaterally, respirations unlabored  Heart:    Normal heart rate. Normal rhythm with frequent premature beats. No murmurs, rubs, or gallops.    MS:   All extremities are intact.    Neurologic:   Awake, alert, oriented x 3. No apparent focal neurological defect.         Assessment & Plan     1. Primary hypertension Well controlled.  Continue current medications.   - Comprehensive metabolic panel  2. Hyponatremia Mild on current meds.  - Comprehensive metabolic panel  3. Hypothyroidism, unspecified type  - TSH  4. Hyperglycemia  - Hemoglobin A1c  5. SVT (supraventricular tachycardia) (HCC) Cardiac evaluation in progress.         Mila Merry, MD  [facility] [phone] (phone) [phone] (fax)  [facility]

## 2023-11-12 LAB — TSH: TSH: 3.33 u[IU]/mL (ref 0.450–4.500)

## 2023-11-12 LAB — COMPREHENSIVE METABOLIC PANEL
ALT: 16 [IU]/L (ref 0–44)
AST: 19 [IU]/L (ref 0–40)
Albumin: 4.2 g/dL (ref 3.6–4.6)
Alkaline Phosphatase: 123 [IU]/L — ABNORMAL HIGH (ref 44–121)
BUN/Creatinine Ratio: 20 (ref 10–24)
BUN: 19 mg/dL (ref 10–36)
Bilirubin Total: 0.5 mg/dL (ref 0.0–1.2)
CO2: 23 mmol/L (ref 20–29)
Calcium: 9.1 mg/dL (ref 8.6–10.2)
Chloride: 96 mmol/L (ref 96–106)
Creatinine, Ser: 0.93 mg/dL (ref 0.76–1.27)
Globulin, Total: 2.2 g/dL (ref 1.5–4.5)
Glucose: 112 mg/dL — ABNORMAL HIGH (ref 70–99)
Potassium: 4.6 mmol/L (ref 3.5–5.2)
Sodium: 132 mmol/L — ABNORMAL LOW (ref 134–144)
Total Protein: 6.4 g/dL (ref 6.0–8.5)
eGFR: 78 mL/min/{1.73_m2} (ref >59)

## 2023-11-12 LAB — HEMOGLOBIN A1C
Est. average glucose Bld gHb Est-mCnc: 120 mg/dL
Hgb A1c MFr Bld: 5.8 % — ABNORMAL HIGH (ref 4.8–5.6)

## 2023-11-21 ENCOUNTER — Ambulatory Visit: Payer: Medicare Other | Attending: Student

## 2023-11-21 DIAGNOSIS — I491 Atrial premature depolarization: Secondary | ICD-10-CM

## 2023-11-21 DIAGNOSIS — R600 Localized edema: Secondary | ICD-10-CM

## 2023-11-21 DIAGNOSIS — I10 Essential (primary) hypertension: Secondary | ICD-10-CM

## 2023-11-21 LAB — ECHOCARDIOGRAM COMPLETE: Area-P 1/2: 2.83 cm2

## 2023-12-03 ENCOUNTER — Other Ambulatory Visit: Payer: Self-pay | Admitting: Medical Genetics

## 2023-12-22 ENCOUNTER — Other Ambulatory Visit: Payer: Self-pay | Admitting: Family Medicine

## 2023-12-22 ENCOUNTER — Encounter: Payer: Self-pay | Admitting: Family Medicine

## 2023-12-22 DIAGNOSIS — I10 Essential (primary) hypertension: Secondary | ICD-10-CM

## 2023-12-22 DIAGNOSIS — E039 Hypothyroidism, unspecified: Secondary | ICD-10-CM

## 2023-12-23 MED ORDER — AMLODIPINE BESYLATE 2.5 MG PO TABS: 2.5000 mg | ORAL_TABLET | Freq: Every day | ORAL | 1 refills | Status: DC

## 2023-12-23 MED ORDER — LISINOPRIL 20 MG PO TABS: 20.0000 mg | ORAL_TABLET | Freq: Every day | ORAL | 3 refills | Status: DC

## 2023-12-23 MED ORDER — LEVOTHYROXINE SODIUM 25 MCG PO TABS: 25.0000 ug | ORAL_TABLET | Freq: Every day | ORAL | 3 refills | Status: DC

## 2024-01-02 NOTE — Progress Notes (Signed)
Cardiology Clinic Note   Date: 01/03/2024 ID: Travis Austin, DOB February 20, 1931, MRN [id number]  Primary Cardiologist:  Lorine Bears, MD  Patient Profile    Travis Austin is a 89 y.o. male who presents to the clinic today for follow up after testing.    Past medical history significant for: PACs. 7-day ZIO 10/23/2023: HR 44 to 160 bpm, average 61 bpm.  17 episodes of SVT, longest 47.4 seconds with average rate 119 bpm.  Frequent supraventricular ectopy 16%.  Occasional ventricular ectopy 2.1%.  No A-fib detected. Echo 11/21/2023: EF 60 to 65%.  No RWMA.  Moderate asymmetric LVH of the basal-septal segment.  Grade I DD.  Normal RV size/function.  Mild MR.  Mild to moderate AI. RBBB. Hypertension. Hypothyroidism.  In summary, patient was first seen by Dr. Kirke Corin on 06/01/2021 for an episode of asymptomatic bradycardia.  Patient was seen in the ED for bradycardia that he noted while checking his pulse ox.  Heart rate in the 30s to 40s.  Heart rate in the ED was 37 bpm.  He was noted to have frequent PACs.  Verapamil was discontinued.  His workup was otherwise unremarkable.  At the time of his visit he was no longer bradycardic.  He was hypertensive at the time of his visit amlodipine was added.      History of Present Illness    Travis Austin is followed by Dr. Kirke Corin for the above outlined history.  Patient was last seen in the office by me on 11/01/2023 to follow-up on findings of 7-day ZIO ordered by PCP.  Patient reported noticing irregular heartbeat on his home BP monitor for which he was evaluated by his PCP and wore a 7-day ZIO which showed 17 runs of SVT longest 47.4 seconds and frequent supraventricular ectopy (16%).  EKG at the time of his visit showed sinus bradycardia with occasional PVCs and PACs, RBBB.  Patient was asymptomatic.  He underwent echo which showed normal LV/RV function, mild MR, mild to moderate AI.  Today, patient is here alone. He is doing well. PCP recently changed  him from losartan to lisinopril at his request and he has not experienced an irregular heart beat since then (no alerts from his BP monitor). Patient denies shortness of breath, dyspnea on exertion, lower extremity edema, orthopnea or PND. No chest pain, pressure, or tightness. He has began indoor cycling for 30 minutes daily. His hope is to workup to an hour a day.      ROS: All other systems reviewed and are otherwise negative except as noted in History of Present Illness.  EKGs/Labs Reviewed    EKG Interpretation Date/Time:  Friday January 03 2024 13:25:45 EST Ventricular Rate:  61 PR Interval:  208 QRS Duration:  144 QT Interval:  426 QTC Calculation: 428 R Axis:   73  Text Interpretation: Normal sinus rhythm Right bundle branch block When compared with ECG of 01-Nov-2023 13:20, Premature ventricular complexes are no longer Present Premature atrial complexes are no longer Present Confirmed by Carlos Levering [phone]) on 01/03/2024 1:28:25 PM   11/11/2023: ALT 16; AST 19; BUN 19; Creatinine, Ser 0.93; Potassium 4.6; Sodium 132   11/11/2023: TSH 3.330   Physical Exam    VS:  BP (!) 140/70 (BP Location: Left Arm, Patient Position: Sitting, Cuff Size: Normal)   Pulse 61   Ht 5' 8.5" (1.74 m)   Wt 152 lb (68.9 kg)   BMI 22.78 kg/m  , BMI Body mass index is  22.78 kg/m.  GEN: Well nourished, well developed, in no acute distress. Neck: No JVD or carotid bruits. Cardiac:  RRR. No murmurs. No rubs or gallops.   Respiratory:  Respirations regular and unlabored. Clear to auscultation without rales, wheezing or rhonchi. GI: Soft, nontender, nondistended. Extremities: Radials/DP/PT 2+ and equal bilaterally. No clubbing or cyanosis. Mild, nonpitting edema bilateral lower extremities. Compression socks in place.   Skin: Warm and dry, no rash. Neuro: Strength intact.  Assessment & Plan   PACs 7 day Zio November 2024 showed frequent supraventricular ectopy (16%) and 17 runs of SVT,  longest 47.4 seconds.  Echo December 2024 showed normal LV/RV function, LVH, Grade I DD, mild MR, mild to moderate AI.  Patient denies cardiac awareness of fast runs or PACs. He denies shortness of breath, dizziness, lightheadedness, presyncope or syncope. He has increased his activity and is not performing indoor cycling for 30 minutes every day with a goal of increasing to an hour a day. He reports no further alerts from his BP monitor since switching from losartan to lisinopril.  -Continue to monitor.   Hypertension BP today 140/70. Home BP consistently in the 130s/70s. His PCP recently changed him from losartan to lisinopril per his request. He feels he has good BP on this. No report of headaches or dizziness.     -Continue Lisinopril and amlodipine.   Chronic Lower Extremity Edema Mild daily leg swelling, improved from previous severe edema while on higher dose of amlodipine. Mild, nonpitting edema bilateral lower extremities. No DOE, orthopnea or PND. -Continue compression socks.   Disposition: Return in 1 year or sooner as needed.          Signed, Etta Grandchild. Miracle Criado, DNP, NP-C

## 2024-01-03 ENCOUNTER — Encounter: Payer: Self-pay | Admitting: Student

## 2024-01-03 ENCOUNTER — Ambulatory Visit: Payer: Medicare Other | Attending: Student | Admitting: Student

## 2024-01-03 VITALS — BP 140/70 | HR 61 | Ht 68.5 in | Wt 152.0 lb

## 2024-01-03 DIAGNOSIS — R600 Localized edema: Secondary | ICD-10-CM | POA: Diagnosis present

## 2024-01-03 DIAGNOSIS — I10 Essential (primary) hypertension: Secondary | ICD-10-CM | POA: Insufficient documentation

## 2024-01-03 DIAGNOSIS — I491 Atrial premature depolarization: Secondary | ICD-10-CM | POA: Insufficient documentation

## 2024-01-03 NOTE — Patient Instructions (Signed)
Medication Instructions:  Your Physician recommend you continue on your current medication as directed.    *If you need a refill on your cardiac medications before your next appointment, please call your pharmacy*   Lab Work: None ordered at this time  If you have labs (blood work) drawn today and your tests are completely normal, you will receive your results only by: MyChart Message (if you have MyChart) OR A paper copy in the mail If you have any lab test that is abnormal or we need to change your treatment, we will call you to review the results.   Follow-Up: At [facility], you and your health needs are our priority.  As part of our continuing mission to provide you with exceptional heart care, we have created designated Provider Care Teams.  These Care Teams include your primary Cardiologist (physician) and Advanced Practice Providers (APPs -  Physician Assistants and Nurse Practitioners) who all work together to provide you with the care you need, when you need it.   Your next appointment:   1 year(s)  Provider:   You may see Lorine Bears, MD or one of the following Advanced Practice Providers on your designated Care Team:   Carlos Levering, NP

## 2024-01-14 ENCOUNTER — Ambulatory Visit: Payer: Medicare Other | Admitting: Cardiovascular Disease

## 2024-05-15 ENCOUNTER — Encounter: Payer: Self-pay | Admitting: Family Medicine

## 2024-05-15 ENCOUNTER — Ambulatory Visit: Payer: Self-pay | Admitting: Family Medicine

## 2024-05-15 VITALS — BP 170/80 | HR 58 | Resp 16 | Wt 149.5 lb

## 2024-05-15 DIAGNOSIS — I10 Essential (primary) hypertension: Secondary | ICD-10-CM | POA: Diagnosis not present

## 2024-05-15 DIAGNOSIS — R739 Hyperglycemia, unspecified: Secondary | ICD-10-CM

## 2024-05-15 DIAGNOSIS — E039 Hypothyroidism, unspecified: Secondary | ICD-10-CM

## 2024-05-15 DIAGNOSIS — Z0000 Encounter for general adult medical examination without abnormal findings: Secondary | ICD-10-CM

## 2024-05-15 DIAGNOSIS — I498 Other specified cardiac arrhythmias: Secondary | ICD-10-CM | POA: Diagnosis not present

## 2024-05-15 DIAGNOSIS — I471 Supraventricular tachycardia, unspecified: Secondary | ICD-10-CM

## 2024-05-16 LAB — MAGNESIUM: Magnesium: 2 mg/dL (ref 1.6–2.3)

## 2024-05-16 LAB — T4 AND TSH
T4, Total: 7.8 ug/dL (ref 4.5–12.0)
TSH: 2.4 u[IU]/mL (ref 0.450–4.500)

## 2024-05-16 LAB — RENAL FUNCTION PANEL
Albumin: 4.2 g/dL (ref 3.6–4.6)
BUN/Creatinine Ratio: 26 — ABNORMAL HIGH (ref 10–24)
BUN: 21 mg/dL (ref 10–36)
CO2: 22 mmol/L (ref 20–29)
Calcium: 9.2 mg/dL (ref 8.6–10.2)
Chloride: 97 mmol/L (ref 96–106)
Creatinine, Ser: 0.82 mg/dL (ref 0.76–1.27)
Glucose: 95 mg/dL (ref 70–99)
Phosphorus: 3.7 mg/dL (ref 2.8–4.1)
Potassium: 4.7 mmol/L (ref 3.5–5.2)
Sodium: 133 mmol/L — ABNORMAL LOW (ref 134–144)
eGFR: 83 mL/min/{1.73_m2} (ref >59)

## 2024-05-16 LAB — HEMOGLOBIN A1C
Est. average glucose Bld gHb Est-mCnc: 117 mg/dL
Hgb A1c MFr Bld: 5.7 % — ABNORMAL HIGH (ref 4.8–5.6)

## 2024-05-17 ENCOUNTER — Ambulatory Visit: Payer: Self-pay | Admitting: Family Medicine

## 2024-06-07 DIAGNOSIS — I498 Other specified cardiac arrhythmias: Secondary | ICD-10-CM | POA: Insufficient documentation

## 2024-06-07 NOTE — Progress Notes (Signed)
 Established patient visit   Patient: Travis Austin   DOB: November 16, 1931   89 y.o. Male  MRN: [id number] Visit Date: 05/15/2024  Today's healthcare provider: Nancyann Perry, MD   Chief Complaint  Patient presents with   Annual Exam   Arm Pain    R arm bump, swelling got into some ants.   Medicare Wellness   Subjective    Discussed the use of AI scribe software for clinical note transcription with the patient, who gave verbal consent to proceed.  History of Present Illness   Travis Austin is a 89 year old male who presents for follow up hypertension, hypothyroid, and mild hyperglycemia reporting itching and concerns about blood pressure irregularities.  He has been experiencing itching for slightly more than two weeks, which began after an incident involving numerous ants near his house. The itching is not painful and is not associated with any other symptoms.  He monitors his blood pressure at home, with readings around 130/50 and occasionally as low as 129/58. His heart rate is in the mid-50s, which he considers low. His blood pressure monitor sometimes detects irregular heartbeats, though inconsistently. He has a history of an abnormal EKG last fall, which led to a change in his blood pressure medication from losartan  to lisinopril . After starting lisinopril , irregularities were no longer detected by his monitor. A follow-up EKG in January was regular.  He feels generally well but experiences fatigue with housework. He recently visited his sister, gained three pounds, and deviated from his usual routine, potentially affecting his blood pressure. He notes fatigue with physical exertion and mentions worsening balance, though it is manageable.       Medications: Outpatient Medications Prior to Visit  Medication Sig   acetaminophen  (TYLENOL ) 325 MG tablet Take 1,000 mg by mouth every 6 (six) hours as needed.   amLODipine  (NORVASC ) 2.5 MG tablet Take 1 tablet (2.5 mg total)  by mouth daily.   levothyroxine  (SYNTHROID ) 25 MCG tablet Take 1 tablet (25 mcg total) by mouth daily before breakfast.   lisinopril  (ZESTRIL ) 20 MG tablet Take 1 tablet (20 mg total) by mouth daily.   Multiple Vitamin (MULTIVITAMIN WITH MINERALS) TABS tablet Take 1 tablet daily by mouth.   No facility-administered medications prior to visit.   Review of Systems     Objective    BP (!) 170/80   Pulse (!) 58   Resp 16   Wt 149 lb 8 oz (67.8 kg)   SpO2 99%   BMI 22.40 kg/m   Physical Exam   General: Appearance:    Well developed, well nourished male in no acute distress  Eyes:    PERRL, conjunctiva/corneas clear, EOM's intact       Lungs:     Clear to auscultation bilaterally, respirations unlabored  Heart:    Bradycardic. Normal rhythm. No murmurs, rubs, or gallops.    MS:   All extremities are intact.    Neurologic:   Awake, alert, oriented x 3. No apparent focal neurological defect.      EKG:  Sinus rhythm with premature atrial complexes in a pattern of bigeminy Right bundle branch block    Assessment & Plan    1. Primary hypertension (Primary) Home blood pressures much better than office reading. Continue current medications.    2. Hypothyroidism, unspecified type Feels well on current thyroid  replacement.   - T4 AND TSH  3. Hyperglycemia  - Hemoglobin A1c  4. Atrial bigeminy New  incidental finding on ECG today. Counseled of typically benign course of this arrhythmia. Will forward a copy of today's ECG to his cardiologist to review and he patient is to call if chest palpitations, racing heart, chest pains, or dyspnea.    - Renal function panel - Magnesium - EKG 12-Lead      Nancyann Perry, MD  [facility] [phone] (phone) [phone] (fax)  [facility]

## 2024-06-07 NOTE — Progress Notes (Signed)
 Annual Wellness Visit     Patient: Travis Austin, Male    DOB: [birth year], 89 y.o.   MRN: [id number] Visit Date: 05/15/2024  Today's Provider: Nancyann Perry, MD   Chief Complaint  Patient presents with   Annual Exam   Arm Pain    R arm bump, swelling got into some ants.   Medicare Wellness   Subjective    Travis Austin is a 89 y.o. male who presents today for his Annual Wellness Visit.   Medications: Outpatient Medications Prior to Visit  Medication Sig   acetaminophen  (TYLENOL ) 325 MG tablet Take 1,000 mg by mouth every 6 (six) hours as needed.   amLODipine  (NORVASC ) 2.5 MG tablet Take 1 tablet (2.5 mg total) by mouth daily.   levothyroxine  (SYNTHROID ) 25 MCG tablet Take 1 tablet (25 mcg total) by mouth daily before breakfast.   lisinopril  (ZESTRIL ) 20 MG tablet Take 1 tablet (20 mg total) by mouth daily.   Multiple Vitamin (MULTIVITAMIN WITH MINERALS) TABS tablet Take 1 tablet daily by mouth.   No facility-administered medications prior to visit.    Allergies  Allergen Reactions   Indapamide      Very low sodium =116 12/2019   Alpha-Gal     (meat allergy from tick bite)   Verapamil      Severe bradycardia    Patient Care Team: Perry Nancyann BRAVO, MD as PCP - General (Family Medicine) Darron Deatrice LABOR, MD as PCP - Cardiology (Cardiology) Myrna Adine Anes, MD as Consulting Physician (Ophthalmology) Hester Alm BROCKS, MD as Consulting Physician (Dermatology)     Objective     Most recent functional status assessment:    05/15/2024    1:17 PM  In your present state of health, do you have any difficulty performing the following activities:  Hearing? 0  Vision? 0  Difficulty concentrating or making decisions? 0  Walking or climbing stairs? 0  Dressing or bathing? 0  Doing errands, shopping? 0  Preparing Food and eating ? N  Using the Toilet? N  In the past six months, have you accidently leaked urine? N  Do you have problems with loss of bowel  control? N  Managing your Medications? N  Managing your Finances? N  Housekeeping or managing your Housekeeping? N   Most recent fall risk assessment:    05/15/2024    1:21 PM  Fall Risk   Falls in the past year? 1  Number falls in past yr: 0  Injury with Fall? 0  Risk for fall due to : No Fall Risks    Most recent depression screenings:    05/15/2024    1:21 PM 11/11/2023    1:59 PM  PHQ 2/9 Scores  PHQ - 2 Score 0 0  PHQ- 9 Score  0   Most recent cognitive screening:    05/15/2024    1:21 PM  6CIT Screen  What Year? 0 points  What month? 0 points  What time? 0 points  Count back from 20 0 points  Months in reverse 0 points  Repeat phrase 0 points  Total Score 0 points   Most recent Audit-C alcohol  use screening    05/08/2024    7:54 PM  Alcohol  Use Disorder Test (AUDIT)  1. How often do you have a drink containing alcohol ? 4  2. How many drinks containing alcohol  do you have on a typical day when you are drinking? 0  3. How often do you have six or more  drinks on one occasion? 0  AUDIT-C Score 4   4. How often during the last year have you found that you were not able to stop drinking once you had started? 0  5. How often during the last year have you failed to do what was normally expected from you because of drinking? 0  6. How often during the last year have you needed a first drink in the morning to get yourself going after a heavy drinking session? 0  7. How often during the last year have you had a feeling of guilt of remorse after drinking? 0  8. How often during the last year have you been unable to remember what happened the night before because you had been drinking? 0  9. Have you or someone else been injured as a result of your drinking? 0  10. Has a relative or friend or a doctor or another health worker been concerned about your drinking or suggested you cut down? 0  Alcohol  Use Disorder Identification Test Final Score (AUDIT) 4      Patient-reported    A score of 3 or more in women, and 4 or more in men indicates increased risk for alcohol  abuse, EXCEPT if all of the points are from question 1     Assessment & Plan     Annual wellness visit done today including the all of the following: Reviewed patient's Family Medical History Reviewed and updated list of patient's medical providers Assessment of cognitive impairment was done Assessed patient's functional ability Established a written schedule for health screening services Health Risk Assessent Completed and Reviewed  Exercise Activities and Dietary recommendations  Goals      LIFESTYLE - DECREASE FALLS RISK     Recommend to remove any items from the home that may cause slips or trips.        Immunization History  Administered Date(s) Administered   Fluad Quad(high Dose 65+) 08/29/2021   Influenza, High Dose Seasonal PF 08/21/2017, 09/11/2018, 09/30/2022   Influenza-Unspecified 09/11/2018, 08/05/2020, 09/16/2023   Moderna Covid-19 Fall Seasonal Vaccine 30yrs & older 09/16/2023   PFIZER(Purple Top)SARS-COV-2 Vaccination 01/31/2020, 02/23/2020, 09/17/2020   Pfizer Covid-19 Vaccine Bivalent Booster 78yrs & up 03/16/2021, 09/18/2022, 04/01/2023   Pneumococcal Conjugate-13 01/21/2015   Pneumococcal Polysaccharide-23 07/02/2001   Td 05/29/2004   Tdap 08/24/2011, 05/09/2023   Zoster Recombinant(Shingrix ) 12/26/2018, 02/25/2019   Zoster, Live 11/24/2008    Health Maintenance  Topic Date Due   COVID-19 Vaccine (8 - 2024-25 season) 03/15/2024   Medicare Annual Wellness (AWV)  05/05/2024   INFLUENZA VACCINE  07/17/2024   DTaP/Tdap/Td (4 - Td or Tdap) 05/08/2033   Pneumococcal Vaccine: 50+ Years  Completed   Zoster Vaccines- Shingrix   Completed   HPV VACCINES  Aged Out   Meningococcal B Vaccine  Aged Out   Colonoscopy  Discontinued     Discussed health benefits of physical activity, and encouraged him to engage in regular exercise appropriate for his age and  condition.          Nancyann Perry, MD  [facility] [phone] (phone) [phone] (fax)  [facility]

## 2024-06-17 ENCOUNTER — Ambulatory Visit: Payer: Self-pay

## 2024-06-17 NOTE — Telephone Encounter (Signed)
 Daughter describes episodes of confusion, choosing incorrect words, speaking in a way that he is not understood.  He refused going to the hospital last night. Encouraged daughter that these can be signs of a stroke and the sooner the patient gets evaluated the better.  Explained to the daughter that if this is a stroke for him to come to the office we would have to then send him to the hospital for the treatment.  She is going to try and get father to go to the hospital.  She does not think he will go by rescue but possibly will allow her to take him.

## 2024-06-17 NOTE — Telephone Encounter (Signed)
 FYI Only or Action Required?: Action required by provider: request for appointment and update on patient condition.  Patient was last seen in primary care on 05/15/2024 by Travis Nancyann BRAVO, MD. Called Nurse Triage reporting Neurologic Problem. Symptoms began yesterday. Interventions attempted: Nothing. Symptoms are: confusion/change in speech/aphasia, hoarse voice unchanged.  Triage Disposition: Call EMS 911 Now  Patient/caregiver understands and will follow disposition?: No, wishes to speak with PCP                          Copied from CRM [id number]. Topic: Clinical - Red Word Triage >> Jun 17, 2024 10:40 AM Donee H wrote: [facility] that prompted transfer to Nurse Triage: Patient's daughter called stating patient is mixing up his words and he is confused. Reason for Disposition  [1] Loss of speech or garbled speech AND [2] sudden onset AND [3] present now  Answer Assessment - Initial Assessment Questions Daughter called rescue and patient refused to go to ED. Daughter, [name], asking to schedule patient an appointment with Dr Travis. Advised daughter due to symptoms being risk for stroke, unable to schedule patient at this time and advised call 911/go to ED. Called [facility] and spoke with [place]. Transferred daughter to medical assistant to confirm patient needs to go to ED.  1. SYMPTOM: What is the main symptom you are concerned about? (e.g., weakness, numbness)     Confusion and abnormal speech (she states he is mixing up words. If he was talking about a computer he called it a lockbox. He's talking about ideas that don't relate such as insurance and printers),hoarse voice.  2. ONSET: When did this start? (minutes, hours, days; while sleeping)     Yesterday, he was acting a little weird a lunch. Daughter checked on him last night and called 911, patient refused to go to ED>  3. LAST NORMAL: When was the last time you (the patient) were normal (no symptoms)?     June  30th or 31st.  4. PATTERN Does this come and go, or has it been constant since it started?  Is it present now?     Constant since yesterday, present now.  5. CARDIAC SYMPTOMS: Have you had any of the following symptoms: chest pain, difficulty breathing, palpitations?     No.  6. NEUROLOGIC SYMPTOMS: Have you had any of the following symptoms: headache, dizziness, vision loss, double vision, changes in speech, unsteady on your feet?     No.  7. OTHER SYMPTOMS: Do you have any other symptoms?     No.  8. PREGNANCY: Is there any chance you are pregnant? When was your last menstrual period?     N/A.  Protocols used: Neurologic Deficit-A-AH

## 2024-06-19 ENCOUNTER — Other Ambulatory Visit: Payer: Self-pay | Admitting: Family Medicine

## 2024-06-19 DIAGNOSIS — I10 Essential (primary) hypertension: Secondary | ICD-10-CM

## 2024-06-26 ENCOUNTER — Telehealth: Payer: Self-pay

## 2024-06-26 NOTE — Transitions of Care (Post Inpatient/ED Visit) (Unsigned)
   06/26/2024  Name: Travis Austin MRN: [id number] DOB: 1931/01/02  Today's TOC FU Call Status: Today's TOC FU Call Status:: Unsuccessful Call (1st Attempt) Unsuccessful Call (1st Attempt) Date: 06/26/24  Attempted to reach the patient regarding the most recent Inpatient/ED visit.  Follow Up Plan: Additional outreach attempts will be made to reach the patient to complete the Transitions of Care (Post Inpatient/ED visit) call.   Signature Julian Lemmings, LPN [facility] Nurse Health Advisor Direct Dial [phone]

## 2024-06-29 NOTE — Transitions of Care (Post Inpatient/ED Visit) (Unsigned)
   06/29/2024  Name: Travis Austin MRN: [id number] DOB: 09-11-1931  Today's TOC FU Call Status: Today's TOC FU Call Status:: Unsuccessful Call (2nd Attempt) Unsuccessful Call (1st Attempt) Date: 06/26/24 Unsuccessful Call (2nd Attempt) Date: 06/29/24  Attempted to reach the patient regarding the most recent Inpatient/ED visit.  Follow Up Plan: Additional outreach attempts will be made to reach the patient to complete the Transitions of Care (Post Inpatient/ED visit) call.   Signature Julian Lemmings, LPN [facility] Nurse Health Advisor Direct Dial [phone]

## 2024-06-30 NOTE — Transitions of Care (Post Inpatient/ED Visit) (Signed)
   06/30/2024  Name: [name] MRN: [id number] DOB: [birth year]  Today's TOC FU Call Status: Today's TOC FU Call Status:: Unsuccessful Call (3rd Attempt) Unsuccessful Call (1st Attempt) Date: 06/26/24 Unsuccessful Call (2nd Attempt) Date: 06/29/24 Unsuccessful Call (3rd Attempt) Date: 06/30/24  Attempted to reach the patient regarding the most recent Inpatient/ED visit.  Follow Up Plan: No further outreach attempts will be made at this time. We have been unable to contact the patient.  Signature Julian Lemmings, LPN [facility] Nurse Health Advisor Direct Dial [phone]

## 2024-10-02 ENCOUNTER — Other Ambulatory Visit: Payer: Self-pay | Admitting: Medical Genetics

## 2024-10-02 DIAGNOSIS — Z006 Encounter for examination for normal comparison and control in clinical research program: Secondary | ICD-10-CM

## 2024-12-19 ENCOUNTER — Other Ambulatory Visit: Payer: Self-pay | Admitting: Family Medicine

## 2024-12-19 DIAGNOSIS — E039 Hypothyroidism, unspecified: Secondary | ICD-10-CM

## 2024-12-22 NOTE — Progress Notes (Signed)
 "  Cardiology Clinic Note   Date: 12/24/2024 ID: Travis Austin, DOB [birth year], MRN [id number]  Primary Cardiologist:  Deatrice Cage, MD  Chief Complaint   Travis Austin is a 89 y.o. male who presents to the clinic today for routine follow-up.  Patient Profile   Travis Austin is followed by Dr. Cage for the history outlined below.      Past medical history significant for: PACs. 7-day ZIO 10/23/2023: HR 44 to 160 bpm, average 61 bpm.  17 episodes of SVT, longest 47.4 seconds with average rate 119 bpm.  Frequent supraventricular ectopy 16%.  Occasional ventricular ectopy 2.1%.  No A-fib detected. RBBB. Hypertension. Hypothyroidism. CVA. CT brain 06/18/2024: Findings consistent with acute left frontal extra-axial collection probably representing small left subdural hemorrhage.  The appearance is atypical.  Short interval follow-up CT recommended. Repeat CT brain 06/18/2024: Mild acute left frontal subarachnoid hemorrhage without significant change.  Bilateral chronic subdural hematomas or subdural hygromas without significant change. 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  Acute subarachnoid hemorrhage is felt to be less likely.  Normal MRI of the head and neck.  No evidence of hemodynamically significant carotid stenosis. Echo with bubble study 06/20/2024: EF 45%.  Grade 1 DD.  Normal RV size/function.  Mild AR, trivial MR.  No valvular stenosis.  No bubble shunting at rest Valsalva not able to visualize.  In summary, patient was first seen by Dr. Cage on 06/01/2021 for an episode of asymptomatic bradycardia.  Patient was seen in the  ED for bradycardia that he noted while checking his pulse ox.  Heart rate in the 30s to 40s.  Heart rate in the ED was 37 bpm.  He was noted to have frequent PACs.  Verapamil  was discontinued.  His workup was otherwise unremarkable.  At the time of his visit he was no longer bradycardic.  He was hypertensive at the time of his visit amlodipine  was added.  Patient was seen in the clinic on 11/01/2023 to follow-up on findings of 7-day ZIO ordered by PCP. Patient reported noticing irregular heartbeat on his home BP monitor for which he was evaluated by his PCP and wore a 7-day ZIO which showed 17 runs of SVT longest 47.4 seconds and frequent supraventricular ectopy (16%). EKG at the time of his visit showed sinus bradycardia with occasional PVCs and PACs, RBBB. Patient was asymptomatic. He underwent echo which showed normal LV/RV function, mild MR, mild to moderate AI.   Patient was last seen in the office by me on 01/03/2024 for follow-up after testing.  He was doing well at that time.  His PCP had changed him from losartan  to lisinopril .  He reported no further episodes of irregular heartbeat since the change.  He was active performing indoor cycling for 30 minutes daily with a goal to increase that to 60 minutes daily.  No medication changes were made.  Patient underwent hospitalization at [facility] for stroke.  Patient presented to the ED after reportedly sustaining 2 unwitnessed falls including striking his head.  He initially refused ED visit but finally relented.  He underwent extensive study as detailed above.     History of Present Illness  Today, patient is accompanied by his daughter and granddaughter. Patient denies shortness of breath, dyspnea on exertion, orthopnea or PND.  He states he will sometimes feel short of breath but attributes that to holding his breath when he is doing activities.  He reports if he focuses on breathing normally he does not have any issue.  Patient has  chronic bilateral ankle and foot edema unchanged from previous.  No chest pain, pressure, or tightness. No palpitations.  He is concerned about numbness of his toes and wonders if he has decreased circulation.  He reports quite a bit of weakness and fatigue following his stroke in July but feels it is getting better.  However, his family feels since the beginning of December he has been more fatigued and a lot more grumpy.  He was previously performing indoor cycling and is hoping to get back to that.  He was able to ride his bike 2 times this week for 20 minutes each time.  He [place] his daughter's [place].  He states they built a small house for him so he can live independently but still have around-the-clock help.  He reports noting his blood pressure has been trending up with SBP in the 150s.  He requests changing from lisinopril  to losartan  as he feels he will have better BP control on the losartan .    ROS: All other systems reviewed and are otherwise negative except as noted in History of Present Illness.  EKGs/Labs Reviewed    EKG Interpretation Date/Time:  Thursday December 24 2024 14:03:39 EST Ventricular Rate:  68 PR Interval:  164 QRS Duration:  138 QT Interval:  442 QTC Calculation: 469 R Axis:   10  Text Interpretation: Sinus rhythm with Premature atrial complexes Right bundle branch block When compared with ECG of 03-Jan-2024 13:25, Premature atrial complexes are now Present Confirmed by Loistine Sober [phone]) on 12/24/2024 2:15:27 PM   05/15/2024: BUN 21; Creatinine, Ser 0.82; Potassium 4.7; Sodium 133   05/15/2024: TSH 2.400    Risk Assessment/Calculations      HYPERTENSION CONTROL Vitals:   12/24/24 1358 12/24/24 1742  BP: (!) 150/78 (!) 148/76    The patient's blood pressure is elevated above target today.  In order to address the patient's elevated BP: A new medication was prescribed today.           Physical Exam     VS:  BP (!) 148/76 (BP Location: Left Arm, Patient Position: Sitting, Cuff Size: Normal)   Pulse 68 Comment: 72 oximeter  Ht 5' 9 (1.753 m)   Wt 152 lb (68.9 kg)   SpO2 100%   BMI 22.45 kg/m  , BMI Body mass index is 22.45 kg/m.  GEN: Well nourished, well developed, in no acute distress. Neck: No JVD or carotid bruits. Cardiac:  RRR.  No murmur. No rubs or gallops.   Respiratory:  Respirations regular and unlabored. Clear to auscultation without rales, wheezing or rhonchi. GI: Soft, nontender, nondistended. Extremities: Radials/DP/PT 2+ and equal bilaterally. No clubbing or cyanosis.  Mild to moderate nonpitting lower extremity edema. Skin: Warm and dry, no rash. Neuro: Strength intact.  Assessment & Plan   PACs 7 day Zio November 2024 showed frequent supraventricular ectopy (16%) and 17 runs of SVT, longest 47.4 seconds.  Echo July 2025 showed EF 45% grade I DD, mild MR, mild  AI.  Patient denies palpitations.  EKG demonstrates sinus rhythm with PACs 68 bpm. - 14-day Zio monitor  as below. - Consider repeat echo at follow-up.   Hypertension BP today 150/78 on intake and 148/76 on my recheck.  Home BP has been trending into the 150s systolic.  Patient would like to change from lisinopril  to losartan . - Continue amlodipine . - Stop lisinopril .  Start losartan  50 mg daily.   Chronic Lower Extremity Edema Mild daily leg swelling that is at its best in the morning and progresses throughout the day.  Edema typically in the ankles and feet.. Mild, nonpitting edema bilateral lower extremities. No DOE, orthopnea or PND. -Continue compression socks.  CVA Patient with hospitalization in July 2025 at [facility] for subacute stroke.  He had had multiple falls prior to presenting to the ED.  Patient underwent extensive workup as detailed above.  Echo demonstrated no significant valvular stenoses, bubble study was negative. - 14-day Zio. -CBC, BMP, TSH, mg today.  Disposition: 14-day ZIO.  CBC,  BMP, TSH, Mg today.  Stop lisinopril .  Start losartan  50 mg daily.  Return in 3 months or sooner as needed.         Signed, Travis HERO. Ahkeem Goede, DNP, NP-C  "

## 2024-12-24 ENCOUNTER — Encounter: Payer: Self-pay | Admitting: Student

## 2024-12-24 ENCOUNTER — Ambulatory Visit: Attending: Student | Admitting: Student

## 2024-12-24 ENCOUNTER — Ambulatory Visit

## 2024-12-24 VITALS — BP 148/76 | HR 68 | Ht 69.0 in | Wt 152.0 lb

## 2024-12-24 DIAGNOSIS — I10 Essential (primary) hypertension: Secondary | ICD-10-CM | POA: Diagnosis present

## 2024-12-24 DIAGNOSIS — I491 Atrial premature depolarization: Secondary | ICD-10-CM

## 2024-12-24 DIAGNOSIS — Z8673 Personal history of transient ischemic attack (TIA), and cerebral infarction without residual deficits: Secondary | ICD-10-CM | POA: Diagnosis present

## 2024-12-24 DIAGNOSIS — R600 Localized edema: Secondary | ICD-10-CM | POA: Insufficient documentation

## 2024-12-24 MED ORDER — LOSARTAN POTASSIUM 50 MG PO TABS: 25.0000 mg | ORAL_TABLET | Freq: Every day | ORAL | 3 refills | Status: DC

## 2024-12-24 NOTE — Patient Instructions (Signed)
 Medication Instructions:  Your physician recommends the following medication changes.  STOP TAKING: Lisinopril   START TAKING: Losartan  50 Mg Daily  DECREASE:  *If you need a refill on your cardiac medications before your next appointment, please call your pharmacy*  Lab Work: Your provider would like for you to have following labs drawn today CBC, BMET,TSH,MAG.   If you have labs (blood work) drawn today and your tests are completely normal, you will receive your results only by: MyChart Message (if you have MyChart) OR A paper copy in the mail If you have any lab test that is abnormal or we need to change your treatment, we will call you to review the results.  Testing/Procedures: Your physician has recommended that you wear a Zio monitor.   This monitor is a medical device that records the hearts electrical activity. Doctors most often use these monitors to diagnose arrhythmias. Arrhythmias are problems with the speed or rhythm of the heartbeat. The monitor is a small device applied to your chest. You can wear one while you do your normal daily activities. While wearing this monitor if you have any symptoms to push the button and record what you felt. Once you have worn this monitor for the period of time provider prescribed (Usually 14 days), you will return the monitor device in the postage paid box. Once it is returned they will download the data collected and provide us  with a report which the provider will then review and we will call you with those results. Important tips:  Avoid showering during the first 24 hours of wearing the monitor. Avoid excessive sweating to help maximize wear time. Do not submerge the device, no hot tubs, and no swimming pools. Keep any lotions or oils away from the patch. After 24 hours you may shower with the patch on. Take brief showers with your back facing the shower head.  Do not remove patch once it has been placed because that will interrupt  data and decrease adhesive wear time. Push the button when you have any symptoms and write down what you were feeling. Once you have completed wearing your monitor, remove and place into box which has postage paid and place in your outgoing mailbox.  If for some reason you have misplaced your box then call our office and we can provide another box and/or mail it off for you.     Follow-Up: At [facility], you and your health needs are our priority.  As part of our continuing mission to provide you with exceptional heart care, our providers are all part of one team.  This team includes your primary Cardiologist (physician) and Advanced Practice Providers or APPs (Physician Assistants and Nurse Practitioners) who all work together to provide you with the care you need, when you need it.  Your next appointment:   3 month(s)  Provider:   You may see Deatrice Cage, MD or one of the following Advanced Practice Providers on your designated Care Team:   Lonni Meager, NP Lesley Maffucci, PA-C Bernardino Bring, PA-C Cadence [place], PA-C Tylene Lunch, NP Barnie Hila, NP

## 2024-12-25 ENCOUNTER — Ambulatory Visit: Payer: Self-pay | Admitting: Student

## 2024-12-25 LAB — BASIC METABOLIC PANEL WITH GFR
BUN/Creatinine Ratio: 27 — ABNORMAL HIGH (ref 10–24)
BUN: 21 mg/dL (ref 10–36)
CO2: 21 mmol/L (ref 20–29)
Calcium: 9.1 mg/dL (ref 8.6–10.2)
Chloride: 100 mmol/L (ref 96–106)
Creatinine, Ser: 0.79 mg/dL (ref 0.76–1.27)
Glucose: 106 mg/dL — ABNORMAL HIGH (ref 70–99)
Potassium: 4 mmol/L (ref 3.5–5.2)
Sodium: 136 mmol/L (ref 134–144)
eGFR: 84 mL/min/1.73

## 2024-12-25 LAB — CBC
Hematocrit: 43.3 % (ref 37.5–51.0)
Hemoglobin: 14.3 g/dL (ref 13.0–17.7)
MCH: 32.3 pg (ref 26.6–33.0)
MCHC: 33 g/dL (ref 31.5–35.7)
MCV: 98 fL — ABNORMAL HIGH (ref 79–97)
Platelets: 237 x10E3/uL (ref 150–450)
RBC: 4.43 x10E6/uL (ref 4.14–5.80)
RDW: 12.3 % (ref 11.6–15.4)
WBC: 6 x10E3/uL (ref 3.4–10.8)

## 2024-12-25 LAB — TSH: TSH: 2.93 u[IU]/mL (ref 0.450–4.500)

## 2024-12-25 LAB — MAGNESIUM: Magnesium: 1.8 mg/dL (ref 1.6–2.3)

## 2024-12-25 NOTE — Progress Notes (Signed)
 Last read by Darold JONETTA Bryant Deatrice at 2:31PM on 12/25/2024.

## 2024-12-30 ENCOUNTER — Telehealth: Payer: Self-pay | Admitting: Student

## 2024-12-30 ENCOUNTER — Other Ambulatory Visit: Payer: Self-pay | Admitting: *Deleted

## 2024-12-30 NOTE — Telephone Encounter (Signed)
 Pt states that his blood pressure has been running high for the past couple of days.  He stated that he decided to increased his Losartan  beginning 1/14 from 25 mg to 50 mg.  His current BP readings from yesterday 138/73 and 166/91.  He states that he will continue to monitor his blood pressure and keep Travis Hila, NP updated.  Will send pt's concerns to Travis Hila, NP for further recommendations/advise.SABRA

## 2024-12-30 NOTE — Telephone Encounter (Signed)
 Pt c/o BP issue: STAT if pt c/o blurred vision, one-sided weakness or slurred speech.  STAT if BP is GREATER than 180/120 TODAY.  STAT if BP is LESS than 90/60 and SYMPTOMATIC TODAY  1. What is your BP concern? BP too high   2. Have you taken any BP medication today? Yes  3. What are your last 5 BP readings?Yesterday: 138/73 166/91  4. Are you having any other symptoms (ex. Dizziness, headache, blurred vision, passed out)? No

## 2025-01-04 MED ORDER — LOSARTAN POTASSIUM 50 MG PO TABS: 50.0000 mg | ORAL_TABLET | Freq: Every day | ORAL | 3 refills | Status: AC

## 2025-01-06 NOTE — Telephone Encounter (Signed)
 Last read by Darold JONETTA Bryant Deatrice at 11:12AM on 01/04/2025.

## 2025-03-24 ENCOUNTER — Ambulatory Visit: Admitting: Student
